# Patient Record
Sex: Female | Born: 1945
Health system: Southern US, Community
[De-identification: ages and names within clinical notes are randomized; demographics above are authoritative.]

## PROBLEM LIST (undated history)

## (undated) DIAGNOSIS — Z860100 Personal history of colon polyps, unspecified: Secondary | ICD-10-CM

## (undated) DIAGNOSIS — K219 Gastro-esophageal reflux disease without esophagitis: Secondary | ICD-10-CM

## (undated) DIAGNOSIS — I607 Nontraumatic subarachnoid hemorrhage from unspecified intracranial artery: Secondary | ICD-10-CM

## (undated) DIAGNOSIS — K2270 Barrett's esophagus without dysplasia: Secondary | ICD-10-CM

## (undated) HISTORY — DX: Gastro-esophageal reflux disease without esophagitis: K21.9

## (undated) HISTORY — PX: CEREBRAL ANEURYSM REPAIR: SHX164

## (undated) HISTORY — PX: FEMORAL ARTERY REPAIR: SHX1582

## (undated) HISTORY — DX: Personal history of colonic polyps: Z86.010

## (undated) HISTORY — DX: Nontraumatic subarachnoid hemorrhage from unspecified intracranial artery: I60.7

## (undated) HISTORY — DX: Personal history of colon polyps, unspecified: Z86.0100

## (undated) HISTORY — DX: Barrett's esophagus without dysplasia: K22.70

## (undated) HISTORY — PX: TONSILLECTOMY: SUR1361

---

## 1988-04-12 DIAGNOSIS — I607 Nontraumatic subarachnoid hemorrhage from unspecified intracranial artery: Secondary | ICD-10-CM

## 1988-04-12 HISTORY — DX: Nontraumatic subarachnoid hemorrhage from unspecified intracranial artery: I60.7

## 2004-10-19 ENCOUNTER — Ambulatory Visit (HOSPITAL_COMMUNITY): Admission: RE | Admit: 2004-10-19 | Discharge: 2004-10-19 | Payer: Self-pay | Admitting: Ophthalmology

## 2005-03-10 ENCOUNTER — Ambulatory Visit (HOSPITAL_COMMUNITY): Admission: RE | Admit: 2005-03-10 | Discharge: 2005-03-10 | Payer: Self-pay | Admitting: Obstetrics and Gynecology

## 2005-08-03 ENCOUNTER — Emergency Department (HOSPITAL_COMMUNITY): Admission: EM | Admit: 2005-08-03 | Discharge: 2005-08-03 | Payer: Self-pay | Admitting: Emergency Medicine

## 2006-03-15 ENCOUNTER — Ambulatory Visit (HOSPITAL_COMMUNITY): Admission: RE | Admit: 2006-03-15 | Discharge: 2006-03-15 | Payer: Self-pay | Admitting: Internal Medicine

## 2007-01-23 ENCOUNTER — Ambulatory Visit: Payer: Self-pay | Admitting: Gastroenterology

## 2007-02-09 ENCOUNTER — Ambulatory Visit: Payer: Self-pay | Admitting: Gastroenterology

## 2007-02-09 ENCOUNTER — Ambulatory Visit (HOSPITAL_COMMUNITY): Admission: RE | Admit: 2007-02-09 | Discharge: 2007-02-09 | Payer: Self-pay | Admitting: Gastroenterology

## 2007-02-09 HISTORY — PX: ESOPHAGOGASTRODUODENOSCOPY: SHX1529

## 2007-02-09 HISTORY — PX: COLONOSCOPY: SHX174

## 2007-02-28 ENCOUNTER — Ambulatory Visit: Payer: Self-pay | Admitting: Vascular Surgery

## 2007-03-07 ENCOUNTER — Ambulatory Visit: Payer: Self-pay | Admitting: Vascular Surgery

## 2007-03-20 ENCOUNTER — Ambulatory Visit (HOSPITAL_COMMUNITY): Admission: RE | Admit: 2007-03-20 | Discharge: 2007-03-20 | Payer: Self-pay | Admitting: Obstetrics and Gynecology

## 2007-11-15 ENCOUNTER — Ambulatory Visit (HOSPITAL_COMMUNITY): Admission: RE | Admit: 2007-11-15 | Discharge: 2007-11-15 | Payer: Self-pay | Admitting: Internal Medicine

## 2007-12-18 ENCOUNTER — Other Ambulatory Visit: Admission: RE | Admit: 2007-12-18 | Discharge: 2007-12-18 | Payer: Self-pay | Admitting: Obstetrics and Gynecology

## 2008-03-21 ENCOUNTER — Ambulatory Visit (HOSPITAL_COMMUNITY): Admission: RE | Admit: 2008-03-21 | Discharge: 2008-03-21 | Payer: Self-pay | Admitting: Obstetrics and Gynecology

## 2008-07-29 ENCOUNTER — Ambulatory Visit (HOSPITAL_COMMUNITY): Admission: RE | Admit: 2008-07-29 | Discharge: 2008-07-29 | Payer: Self-pay | Admitting: Obstetrics and Gynecology

## 2008-12-18 ENCOUNTER — Other Ambulatory Visit: Admission: RE | Admit: 2008-12-18 | Discharge: 2008-12-18 | Payer: Self-pay | Admitting: Obstetrics and Gynecology

## 2009-03-24 ENCOUNTER — Ambulatory Visit (HOSPITAL_COMMUNITY): Admission: RE | Admit: 2009-03-24 | Discharge: 2009-03-24 | Payer: Self-pay | Admitting: Internal Medicine

## 2010-01-06 ENCOUNTER — Ambulatory Visit (HOSPITAL_COMMUNITY): Admission: RE | Admit: 2010-01-06 | Discharge: 2010-01-06 | Payer: Self-pay | Admitting: Internal Medicine

## 2010-01-26 ENCOUNTER — Other Ambulatory Visit: Admission: RE | Admit: 2010-01-26 | Discharge: 2010-01-26 | Payer: Self-pay | Admitting: Obstetrics & Gynecology

## 2010-03-26 ENCOUNTER — Ambulatory Visit (HOSPITAL_COMMUNITY): Admission: RE | Admit: 2010-03-26 | Discharge: 2010-03-26 | Payer: Self-pay | Admitting: Obstetrics and Gynecology

## 2011-01-20 ENCOUNTER — Other Ambulatory Visit: Payer: Self-pay | Admitting: Obstetrics and Gynecology

## 2011-01-20 ENCOUNTER — Other Ambulatory Visit (HOSPITAL_COMMUNITY)
Admission: RE | Admit: 2011-01-20 | Discharge: 2011-01-20 | Disposition: A | Discharge status: Home / Self Care | Payer: BC Managed Care – PPO | Source: Ambulatory Visit | Attending: Obstetrics and Gynecology | Admitting: Obstetrics and Gynecology

## 2011-01-20 DIAGNOSIS — Z01419 Encounter for gynecological examination (general) (routine) without abnormal findings: Secondary | ICD-10-CM | POA: Insufficient documentation

## 2011-04-12 ENCOUNTER — Other Ambulatory Visit: Payer: Self-pay | Admitting: Obstetrics and Gynecology

## 2011-04-12 DIAGNOSIS — Z139 Encounter for screening, unspecified: Secondary | ICD-10-CM

## 2011-04-19 ENCOUNTER — Ambulatory Visit (HOSPITAL_COMMUNITY)
Admission: RE | Admit: 2011-04-19 | Discharge: 2011-04-19 | Disposition: A | Discharge status: Home / Self Care | Payer: BC Managed Care – PPO | Source: Ambulatory Visit | Attending: Obstetrics and Gynecology | Admitting: Obstetrics and Gynecology

## 2011-04-19 DIAGNOSIS — Z1231 Encounter for screening mammogram for malignant neoplasm of breast: Secondary | ICD-10-CM | POA: Insufficient documentation

## 2011-04-19 DIAGNOSIS — Z139 Encounter for screening, unspecified: Secondary | ICD-10-CM

## 2011-04-30 NOTE — Op Note (Signed)
NAMEDESTENY, FREEMAN                ACCOUNT NO.:  [id number]   MEDICAL RECORD NO.:  [id number]          PATIENT TYPE:  AMB   LOCATION:  DAY                           FACILITY:  [facility]   PHYSICIAN:  Kassie Mends, M.D.      DATE OF BIRTH:  13-Jun-1946   DATE OF PROCEDURE:  02/09/2007  DATE OF DISCHARGE:                               OPERATIVE REPORT   REFERRING PHYSICIAN:  Tilda Burrow, M.D.   PROCEDURE:  1. Colonoscopy.  2. Esophagogastroduodenoscopy.   INDICATIONS FOR EXAM:  [name] is a 65 year old female with a  personal history of polyps.  She has been seeing rectal bleeding off and  on for the last year.  She has a problem with constipation and  hemorrhoids.   FINDINGS:  1. Tortuous sigmoid colon and external hemorrhoids.  Otherwise normal      colon without evidence of polyps, masses, diverticula, inflammatory      changes or AVM's.  2. Normal esophagus without evidence of Barrett's.  Normal stomach and      duodenum.   RECOMMENDATIONS:  1. High fiber diet.  The patient was given information on high fiber      diet, polyps and hemorrhoids.  Her likely source of heme positive      stools and rectal bleeding is her external hemorrhoids.  2. Screening colonoscopy in five years.  I will check a CBC today and      if she is anemic then a capsule endoscopy will be performed to      complete an evaluation of her intestines.  3. She should have a repeat upper endoscopy in three years for      Barrett's surveillance.  She is given information on reflux      disease.  She should continue her Nexium 40 mg daily.  4. She may follow-up with me as needed.   MEDICATIONS:  1. Colonoscopy - Demerol 100 mg IV, Versed 8 mg IV.  2. Esophagogastroduodenoscopy - Versed 1 mg IV.   PROCEDURE AND TECHNIQUE:  Physical exam was performed and informed  consent was obtained from the patient after explaining the benefits,  risks and alternatives to the procedure.  The patient was connected  to  the monitor and placed in the left lateral position.  Continuous oxygen  was provided by nasal cannula and IV medicine administered through an  indwelling cannula.  After administration of sedation rectal exam, the  patient's rectum was intubated and the scope was advanced under direct  visualization to the cecum.  The scope was subsequently removed followed  by carefully examining the color, texture, anatomy and integrity of the  mucosa on the way out.  After the colonoscopy, the patient's esophagus was intubated and the  scope was advanced under direct visualization to the second portion of  the duodenum.  The patient was recovered in the endoscopy suite and  discharged home in satisfactory condition.      Kassie Mends, M.D.  Electronically Signed     SM/MEDQ  D:  02/09/2007  T:  02/09/2007  Job:  [id number]  cc:   Tilda Burrow, M.D.  Fax: [phone]

## 2011-04-30 NOTE — Consult Note (Signed)
Courtney Hayes, Courtney Hayes                ACCOUNT NO.:  [id number]   MEDICAL RECORD NO.:  [id number]          PATIENT TYPE:  AMB   LOCATION:                                FACILITY:  [facility]   PHYSICIAN:  Kassie Mends, M.D.      DATE OF BIRTH:  1946/04/29   DATE OF CONSULTATION:  01/23/2007  DATE OF DISCHARGE:                                 CONSULTATION   REFERRING PHYSICIAN:  Tilda Burrow, M.D.   REASON FOR CONSULTATION:  Personal history of polyps, rectal bleeding.   HISTORY OF PRESENT ILLNESS:  Courtney Hayes is a 65 year old female who has  been seeing rectal bleeding off and on for over a year.  She has having  problem with constipation.  She has been attributing her rectal bleeding  to hemorrhoids.  She had colonoscopy approximately 6 years ago without  any problems.  Her colonoscopy was performed in [place].  She has also  had a flexible sigmoidoscopy in the past which showed no evidence of  polyps.  She had Hemoccult performed in her OB/GYN's office and it was  positive.  She does use aspirin 81 mg twice daily due to a remote  history of an infection in her femoral artery.  Her vascular surgeon  recommended that she continue to take daily aspirin.  She denies any use  of ibuprofen, Motrin or Aleve.  She occasionally uses Tylenol.  She  denies any difficulty swallowing.  She does not have any heartburn or  indigestion as long as she takes Nexium.  She has carried the diagnosis  of gastroesophageal reflux disease since 2002.  She denies any abdominal  pain, nausea or vomiting.   PAST MEDICAL HISTORY:  1. Ruptured cerebral aneurysm in 1989 or 1990.  2. Gastroesophageal reflux disease since 2002.   PAST SURGICAL HISTORY:  1. Cerebral aneurysm repair.  2. Femoral artery replacement/transplant in [place].   ALLERGIES:  VALIUM (hives),  possible IV DYE allergy, LATEX ALLERGY   MEDICATIONS:  1. Nexium 40 mg a day.  2. Aspirin 81 mg two p.o. daily.   FAMILY HISTORY:  She has no  family history of colon cancer or colon  polyps, ovarian, breast or uterine cancer.   SOCIAL HISTORY:  She is married and has three children ages 43, 35, and  28.  She is not employed and does not use tobacco or alcohol products.   REVIEW OF SYSTEMS:  Per HPI,otherwise all systems negative.   PHYSICAL EXAMINATION:  GENERAL APPEARANCE:  She is in no apparent  distress, alert and oriented x4.  VITAL SIGNS:  Weight 122 pounds, height 5 feet 3 inches, BMI 21.8  (healthy), temperature 98.1, blood pressure 110/70, pulse 68. HEENT:  Atraumatic and normocephalic.  Pupils are equal, round and reactive to  light.  Mouth:  No oral lesions.  Posterior pharynx without erythema or  exudate. NECK:  Full range of motion, no lymphadenopathy.  LUNGS:  Clear  to auscultation bilaterally. CARDIOVASCULAR:  Regular rhythm, no murmur,  normal S1 and S2.  ABDOMEN:  Bowel sounds are present, soft, nontender,  nondistended,  no rebound or guarding, no hepatosplenomegaly.  No  abdominal bruits, no pulsatile masses. EXTREMITIES:  Without clubbing,  cyanosis, or edema.  NEUROLOGIC:  She has no focal neurologic deficits.   ASSESSMENT:  Courtney Hayes is a 65 year old female with rectal bleeding  which is likely secondary to her hemorrhoids.  She may also have an  upper GI source for Hemoccult positive stool.  The differential  diagnosis includes gastritis, esophagitis and a low likelihood of  gastric malignancy.   Thank you for allowing me to see Courtney Hayes in consultation.  My  recommendations follow.   RECOMMENDATIONS:  1. Courtney Hayes will be scheduled for a colonoscopy to evaluate her      rectal bleeding and heme-positive stool.  She will also be      scheduled for an upper endoscopy.  Her upper endoscopy will be      scheduled for two reasons.  1) To complete the evaluate for heme-      positive stool if no source for her heme-positive stool can be      found in her lower GI tract and 2) To screen for  Barrett's      esophagus.  I did discuss with Courtney Hayes Barrett's surveillance      and she is interested in initiating a screening program.  2. Will schedule follow-up appointment for Courtney Hayes after her      endoscopy is complete.  3. Will also initiate a management program for her hemorrhoids and      after her endoscopy is complete.      Kassie Mends, M.D.  Electronically Signed     SM/MEDQ  D:  01/24/2007  T:  01/24/2007  Job:  [id number]   cc:   Kingsley Callander. Ouida Sills, MD  Fax: [phone]

## 2011-12-24 ENCOUNTER — Encounter: Payer: Self-pay | Admitting: Gastroenterology

## 2012-01-26 ENCOUNTER — Other Ambulatory Visit: Payer: Self-pay | Admitting: Obstetrics and Gynecology

## 2012-01-26 ENCOUNTER — Other Ambulatory Visit (HOSPITAL_COMMUNITY)
Admission: RE | Admit: 2012-01-26 | Discharge: 2012-01-26 | Disposition: A | Discharge status: Home / Self Care | Payer: BC Managed Care – PPO | Source: Ambulatory Visit | Attending: Obstetrics and Gynecology | Admitting: Obstetrics and Gynecology

## 2012-01-26 DIAGNOSIS — Z01419 Encounter for gynecological examination (general) (routine) without abnormal findings: Secondary | ICD-10-CM | POA: Insufficient documentation

## 2012-03-17 ENCOUNTER — Other Ambulatory Visit: Payer: Self-pay | Admitting: Obstetrics and Gynecology

## 2012-03-17 DIAGNOSIS — Z139 Encounter for screening, unspecified: Secondary | ICD-10-CM

## 2012-05-01 ENCOUNTER — Ambulatory Visit (HOSPITAL_COMMUNITY)
Admission: RE | Admit: 2012-05-01 | Discharge: 2012-05-01 | Disposition: A | Discharge status: Home / Self Care | Payer: Medicare Other | Source: Ambulatory Visit | Attending: Obstetrics and Gynecology | Admitting: Obstetrics and Gynecology

## 2012-05-01 DIAGNOSIS — Z1231 Encounter for screening mammogram for malignant neoplasm of breast: Secondary | ICD-10-CM | POA: Insufficient documentation

## 2012-05-01 DIAGNOSIS — Z139 Encounter for screening, unspecified: Secondary | ICD-10-CM

## 2012-10-10 DIAGNOSIS — N390 Urinary tract infection, site not specified: Secondary | ICD-10-CM | POA: Diagnosis not present

## 2012-11-21 DIAGNOSIS — M1990 Unspecified osteoarthritis, unspecified site: Secondary | ICD-10-CM | POA: Diagnosis not present

## 2012-11-21 DIAGNOSIS — Z79899 Other long term (current) drug therapy: Secondary | ICD-10-CM | POA: Diagnosis not present

## 2012-11-21 DIAGNOSIS — E785 Hyperlipidemia, unspecified: Secondary | ICD-10-CM | POA: Diagnosis not present

## 2012-11-27 ENCOUNTER — Other Ambulatory Visit (HOSPITAL_COMMUNITY): Payer: Self-pay | Admitting: Internal Medicine

## 2012-11-27 DIAGNOSIS — I498 Other specified cardiac arrhythmias: Secondary | ICD-10-CM | POA: Diagnosis not present

## 2012-11-27 DIAGNOSIS — M949 Disorder of cartilage, unspecified: Secondary | ICD-10-CM | POA: Diagnosis not present

## 2012-11-27 DIAGNOSIS — K219 Gastro-esophageal reflux disease without esophagitis: Secondary | ICD-10-CM | POA: Diagnosis not present

## 2012-11-27 DIAGNOSIS — K469 Unspecified abdominal hernia without obstruction or gangrene: Secondary | ICD-10-CM | POA: Diagnosis not present

## 2012-11-27 DIAGNOSIS — M899 Disorder of bone, unspecified: Secondary | ICD-10-CM | POA: Diagnosis not present

## 2012-11-27 DIAGNOSIS — Z23 Encounter for immunization: Secondary | ICD-10-CM | POA: Diagnosis not present

## 2012-11-27 DIAGNOSIS — M8580 Other specified disorders of bone density and structure, unspecified site: Secondary | ICD-10-CM

## 2013-01-02 ENCOUNTER — Ambulatory Visit (HOSPITAL_COMMUNITY)
Admission: RE | Admit: 2013-01-02 | Discharge: 2013-01-02 | Disposition: A | Discharge status: Home / Self Care | Payer: Medicare Other | Source: Ambulatory Visit | Attending: Internal Medicine | Admitting: Internal Medicine

## 2013-01-02 DIAGNOSIS — M899 Disorder of bone, unspecified: Secondary | ICD-10-CM | POA: Insufficient documentation

## 2013-01-02 DIAGNOSIS — M949 Disorder of cartilage, unspecified: Secondary | ICD-10-CM | POA: Diagnosis not present

## 2013-01-02 DIAGNOSIS — M8580 Other specified disorders of bone density and structure, unspecified site: Secondary | ICD-10-CM

## 2013-01-16 ENCOUNTER — Telehealth: Payer: Self-pay | Admitting: Gastroenterology

## 2013-01-16 NOTE — Telephone Encounter (Signed)
Pt called to set up tcs per her PCP. I didn't see her on the recall list and she said that she isn't having any problems or have a hx of polyps. I told her that the triage nurse would be calling her back at [phone]. Patient also voiced that last time she had her tcs done that she was over sedated with anesthesia and didn't want to be heavily sedated like that for her procedure. I told her to mention those concerns with the nurse when she calls.

## 2013-01-17 ENCOUNTER — Telehealth: Payer: Self-pay

## 2013-01-17 NOTE — Telephone Encounter (Signed)
See separate triage note.  

## 2013-01-17 NOTE — Telephone Encounter (Signed)
LMOM to call.

## 2013-01-17 NOTE — Telephone Encounter (Signed)
Pt is overdue for repeat EGD for Barrett's surveillance. OV on 02/06/2013 at 8:30 AM with Lorenza Burton, NP to get procedures scheduled.

## 2013-01-17 NOTE — Telephone Encounter (Signed)
Pt called back but Tyler Aas was at lunch. I told she would call her back.

## 2013-02-05 ENCOUNTER — Encounter: Payer: Self-pay | Admitting: Gastroenterology

## 2013-02-06 ENCOUNTER — Ambulatory Visit (INDEPENDENT_AMBULATORY_CARE_PROVIDER_SITE_OTHER): Payer: BC Managed Care – PPO | Admitting: Urgent Care

## 2013-02-06 ENCOUNTER — Encounter (HOSPITAL_COMMUNITY): Payer: Self-pay | Admitting: Pharmacy Technician

## 2013-02-06 ENCOUNTER — Encounter: Payer: Self-pay | Admitting: Urgent Care

## 2013-02-06 VITALS — BP 112/61 | HR 62 | Temp 97.6°F | Ht 62.0 in | Wt 114.0 lb

## 2013-02-06 DIAGNOSIS — Z860100 Personal history of colon polyps, unspecified: Secondary | ICD-10-CM | POA: Insufficient documentation

## 2013-02-06 MED ORDER — PEG 3350-KCL-NA BICARB-NACL 420 G PO SOLR: 4000.0000 mL | ORAL | Status: DC

## 2013-02-06 NOTE — Patient Instructions (Addendum)
Continue Nexium 40mg  daily EGD (upper endoscopy) & colonoscopy with Dr Darrick Penna Call with any questions or concerns. Have a great day!

## 2013-02-06 NOTE — Assessment & Plan Note (Signed)
Courtney Hayes is a pleasant 67 y.o. female with hx of colonic polyps.  She is due for surveillance colonoscopy with Dr Darrick Penna.   I have discussed risks & benefits which include, but are not limited to, bleeding, infection, perforation & drug reaction.  The patient agrees with this plan & written consent will be obtained.

## 2013-02-06 NOTE — Assessment & Plan Note (Signed)
Chronic GERD well controlled on nexium 40mg  daily.  Dr Darrick Penna had recommended EGD in 2012 to look for Barrett's, however pt admits she forgot to follow up.  EGD with Dr Darrick Penna at the same time as colonoscopy.  I have discussed risks & benefits which include, but are not limited to, bleeding, infection, perforation & drug reaction.  The patient agrees with this plan & written consent will be obtained.    Continue nexium 40mg  daily

## 2013-02-06 NOTE — Progress Notes (Signed)
Faxed to PCP

## 2013-02-06 NOTE — Progress Notes (Signed)
Primary Care Physician:  Carylon Perches, MD Primary Gastroenterologist:  Dr. Jonette Eva  Chief Complaint  Patient presents with  . Colonoscopy    HPI:  Courtney Hayes is a 67 y.o. female here to set up colonoscopy & EGD.  She has hx of colon polyps many years ago in [place].  Last colonoscopy by Dr Darrick Penna 5 yrs ago was normal (hemorrhoids).  She did feel "overly sedated" after last colonoscopy & thinks she may have had too much medicine.  Dr Darrick Penna recommended EGD to screen for Barrett's esophagus in 3 yrs, but she never followed up until now.  She takes Nexium 40mg  daily.   Denies heartburn, indigestion, nausea, vomiting, dysphagia, odynophagia or anorexia.   Denies constipation, diarrhea, rectal bleeding, melena or weight loss.  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   Past Medical History  Diagnosis Date  . Barrett's esophagus   . Hx of colonic polyps   . Cerebral aneurysm rupture 04/1988  . GERD (gastroesophageal reflux disease)     Past Surgical History  Procedure Laterality Date  . Colonoscopy    02/09/2007    [id number] sigmoid colon and external hemorrhoids  . Esophagogastroduodenoscopy    02/09/2007    SLF: Normal esophagus without evidence of Barrett's  . Cerebral aneurysm repair    . Femoral artery repair      Current Outpatient Prescriptions  Medication Sig Dispense Refill  . aspirin 81 MG tablet Take 81 mg by mouth daily.      Marland Kitchen esomeprazole (NEXIUM) 40 MG capsule Take 40 mg by mouth daily before breakfast.      . polyethylene glycol-electrolytes (TRILYTE) 420 G solution Take 4,000 mLs by mouth as directed.  4000 mL  0   No current facility-administered medications for this visit.    Allergies as of 02/06/2013 - Review Complete 02/06/2013  Allergen Reaction Noted  . Niacin and related Hives 02/06/2013  . Valium (diazepam) Hives 02/06/2013    Family History  Problem Relation Age of Onset  . Colon cancer Neg Hx      History   Social History  . Marital Status: Married    Spouse Name: N/A    Number of Children: 3  . Years of Education: N/A   Occupational History  . unemployed, Masters in [employer]    Social History Main Topics  . Smoking status: Never Smoker   . Smokeless tobacco: Not on file  . Alcohol Use: No  . Drug Use: No  . Sexually Active: Not on file   Other Topics Concern  . 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 Resp: Denies dyspnea at rest, dyspnea with exercise, cough, sputum, wheezing, coughing up blood, and pleurisy. GI: Denies vomiting blood, jaundice, and fecal incontinence.   Denies dysphagia or odynophagia. GU : Denies urinary burning, blood in urine, urinary frequency, urinary hesitancy, nocturnal urination, and urinary incontinence. MS: Denies joint pain, limitation of movement, and swelling, stiffness, low back pain, extremity pain. Denies muscle weakness, cramps, atrophy.  Derm: Denies rash, itching, dry skin, hives, moles, warts, or unhealing ulcers.  Psych: Denies depression, anxiety, memory loss, suicidal ideation, hallucinations, paranoia, and confusion. Heme: Denies bruising, bleeding, and enlarged lymph nodes. Neuro:  Denies any headaches, dizziness, paresthesias. Endo:  Denies any problems with DM, thyroid, adrenal function.  Physical Exam: BP 112/61  Pulse 62  Temp(Src) 97.6 F (36.4 C) (Oral)  Ht 5\' 2"  (1.575 m)  Wt 114 lb (51.71 kg)  BMI 20.85 kg/m2 No LMP recorded. Patient is postmenopausal. General:   Alert,  Well-developed, well-nourished, pleasant and cooperative in NAD Head:  Normocephalic and atraumatic. Eyes:  Sclera clear, no icterus.   Conjunctiva  pink. Ears:  Normal auditory acuity. Nose:  No deformity, discharge, or lesions. Mouth:  No deformity or lesions,oropharynx pink & moist. Neck:  Supple; no masses or thyromegaly. Lungs:  Clear throughout to auscultation.   No wheezes, crackles, or rhonchi. No acute distress. Heart:  Regular rate and rhythm; no murmurs, clicks, rubs,  or gallops. Abdomen:  Normal bowel sounds.  No bruits.  Soft, non-tender and non-distended without masses, hepatosplenomegaly or hernias noted.  No guarding or rebound tenderness.   Rectal:  Deferred. Msk:  Symmetrical without gross deformities. Normal posture. Pulses:  Normal pulses noted. Extremities:  No clubbing or edema. Neurologic:  Alert and  oriented x4;  grossly normal neurologically. Skin:  Intact without significant lesions or rashes. Lymph Nodes:  No significant cervical adenopathy. Psych:  Alert and cooperative. Normal mood and affect.

## 2013-02-13 ENCOUNTER — Encounter (HOSPITAL_COMMUNITY)
Admission: RE | Disposition: A | Discharge status: Home / Self Care | Payer: Self-pay | Source: Ambulatory Visit | Attending: Gastroenterology

## 2013-02-13 ENCOUNTER — Encounter (HOSPITAL_COMMUNITY): Payer: Self-pay | Admitting: *Deleted

## 2013-02-13 ENCOUNTER — Ambulatory Visit (HOSPITAL_COMMUNITY)
Admission: RE | Admit: 2013-02-13 | Discharge: 2013-02-13 | Disposition: A | Discharge status: Home / Self Care | Payer: Medicare Other | Source: Ambulatory Visit | Attending: Gastroenterology | Admitting: Gastroenterology

## 2013-02-13 DIAGNOSIS — K2990 Gastroduodenitis, unspecified, without bleeding: Secondary | ICD-10-CM | POA: Diagnosis not present

## 2013-02-13 DIAGNOSIS — K2270 Barrett's esophagus without dysplasia: Secondary | ICD-10-CM | POA: Insufficient documentation

## 2013-02-13 DIAGNOSIS — K2970 Gastritis, unspecified, without bleeding: Secondary | ICD-10-CM | POA: Diagnosis not present

## 2013-02-13 DIAGNOSIS — K648 Other hemorrhoids: Secondary | ICD-10-CM | POA: Insufficient documentation

## 2013-02-13 DIAGNOSIS — Z860100 Personal history of colon polyps, unspecified: Secondary | ICD-10-CM | POA: Insufficient documentation

## 2013-02-13 HISTORY — PX: COLONOSCOPY WITH ESOPHAGOGASTRODUODENOSCOPY (EGD): SHX5779

## 2013-02-13 SURGERY — COLONOSCOPY WITH ESOPHAGOGASTRODUODENOSCOPY (EGD)
Anesthesia: Moderate Sedation

## 2013-02-13 MED ORDER — MEPERIDINE HCL 100 MG/ML IJ SOLN
INTRAMUSCULAR | Status: AC
  Filled 2013-02-13: qty 2

## 2013-02-13 MED ORDER — MEPERIDINE HCL 100 MG/ML IJ SOLN
INTRAMUSCULAR | Status: DC | PRN
  Administered 2013-02-13 (×2): 25 mg via INTRAVENOUS

## 2013-02-13 MED ORDER — MIDAZOLAM HCL 5 MG/5ML IJ SOLN
INTRAMUSCULAR | Status: DC | PRN
  Administered 2013-02-13 (×2): 1 mg via INTRAVENOUS
  Administered 2013-02-13: 2 mg via INTRAVENOUS
  Administered 2013-02-13: 1 mg via INTRAVENOUS

## 2013-02-13 MED ORDER — MIDAZOLAM HCL 5 MG/5ML IJ SOLN
INTRAMUSCULAR | Status: AC
  Filled 2013-02-13: qty 10

## 2013-02-13 MED ORDER — SODIUM CHLORIDE 0.45 % IV SOLN
INTRAVENOUS | Status: DC
  Administered 2013-02-13: 1000 mL via INTRAVENOUS

## 2013-02-13 MED ORDER — BUTAMBEN-TETRACAINE-BENZOCAINE 2-2-14 % EX AERO
INHALATION_SPRAY | CUTANEOUS | Status: DC | PRN
  Administered 2013-02-13: 1 via TOPICAL

## 2013-02-13 MED ORDER — STERILE WATER FOR IRRIGATION IR SOLN
Status: DC | PRN
  Administered 2013-02-13: 11:00:00

## 2013-02-13 NOTE — Op Note (Signed)
[facility] [address] [place], [address]   COLONOSCOPY PROCEDURE REPORT  PATIENT: Courtney, Hayes  MR#: [id number] BIRTHDATE: May 29, 1946 , 66  yrs. old GENDER: Female ENDOSCOPIST: Jonette Eva, MD REFERRED Raphael Gibney, M.D.  Christin Bach, M.D. PROCEDURE DATE:  02/13/2013 PROCEDURE:   Colonoscopy, screening  with Entrada overtube INDICATIONS:High risk patient with personal history of colonic polyps. FELT LIKE SHE HAD TOO MUCH SEDATION IN [id number]-[id number] MEDICATIONS: Demerol 50 mg IV and Versed 4 mg IV  DESCRIPTION OF PROCEDURE:    Physical exam was performed.  Informed consent was obtained from the patient after explaining the benefits, risks, and alternatives to procedure.  The patient was connected to monitor and placed in left lateral position. Continuous oxygen was provided by nasal cannula and IV medicine administered through an indwelling cannula.  After administration of sedation and rectal exam, the patients rectum was intubated and the EC-3490Li ([id number]) and EG-2990i ([id number])  colonoscope was advanced under direct visualization to the cecum.  The scope was removed slowly by carefully examining the color, texture, anatomy, and integrity mucosa on the way out.  The patient was recovered in endoscopy and discharged home in satisfactory condition.       COLON FINDINGS: The TC/[place] colon ARE redundant.  OVERTUBE/Manual abdominal counter-pressure was used to reach the cecum.  The patient was moved on to their back to reach the cecum, The colon was otherwise normal.  There was no diverticulosis, inflammation, polyps or cancers unless previously stated.  , and Small internal hemorrhoids were found.  PREP QUALITY: excellent. CECAL W/D TIME: 10 minutes  COMPLICATIONS: None  ENDOSCOPIC IMPRESSION: 1.   Small internal hemorrhoids  RECOMMENDATIONS: HIGH FIBER DIET TCS IN 5 YEARS WITH OVERTUBE/PEDS  SCOPE       _______________________________ Rosalie DoctorJonette Eva, MD 02/13/2013 11:22 AM

## 2013-02-13 NOTE — H&P (Signed)
  Primary Care Physician:  Carylon Perches, MD Primary Gastroenterologist:  Dr. Darrick Penna  Pre-Procedure History & Physical: HPI:  Courtney Hayes is a 67 y.o. female here for SCREENING FOR COLON CA AND Barrett's.  Past Medical History  Diagnosis Date  . Barrett's esophagus   . Hx of colonic polyps   . Cerebral aneurysm rupture 04/1988  . GERD (gastroesophageal reflux disease)    Past Surgical History  Procedure Laterality Date  . Colonoscopy    02/09/2007    PPI:[id number] sigmoid colon and external hemorrhoids  . Esophagogastroduodenoscopy    02/09/2007    SLF: Normal esophagus without evidence of Barrett's  . Cerebral aneurysm repair    . Femoral artery repair      Prior to Admission medications   Medication Sig Start Date End Date Taking? Authorizing Provider  ALPHA LIPOIC ACID PO Take 1 tablet by mouth daily.   Yes Historical Provider, MD  Ascorbic Acid (VITAMIN C) 1000 MG tablet Take 1,000 mg by mouth daily.   Yes Historical Provider, MD  aspirin 81 MG tablet Take 81 mg by mouth daily.   Yes Historical Provider, MD  esomeprazole (NEXIUM) 40 MG capsule Take 40 mg by mouth daily before breakfast.   Yes Historical Provider, MD  fexofenadine (ALLEGRA) 180 MG tablet Take 180 mg by mouth daily.   Yes Historical Provider, MD  fish oil-omega-3 fatty acids 1000 MG capsule Take 1 g by mouth daily.   Yes Historical Provider, MD  L-LYSINE PO Take 1 tablet by mouth daily.   Yes Historical Provider, MD  lactobacillus acidophilus (BACID) TABS Take 1 tablet by mouth daily.   Yes Historical Provider, MD  Multiple Vitamins-Minerals (MULTIVITAMINS THER. W/MINERALS) TABS Take 1 tablet by mouth daily.   Yes Historical Provider, MD    Allergies as of 02/06/2013 - Review Complete 02/06/2013  Allergen Reaction Noted  . Niacin and related Hives 02/06/2013  . Valium (diazepam) Hives 02/06/2013    Family History  Problem Relation Age of Onset  . Colon cancer Neg Hx     History   Social History  .  Marital Status: Married    Spouse Name: N/A    Number of Children: 3  . Years of Education: N/A   Occupational History  . unemployed, Masters in [employer]    Social History Main Topics  . Smoking status: Never Smoker   . Smokeless tobacco: Not on file  . Alcohol Use: No  . Drug Use: No  . Sexually Active: Not on file   Other Topics Concern  . 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 Neck:  Supple; Lungs:  Clear throughout to auscultation.    Heart:  Regular rate and rhythm. Abdomen:  Soft, nontender and nondistended. Normal bowel sounds, without guarding, and without rebound.   Neurologic:  Alert and  oriented x4;  grossly normal neurologically.  Impression/Plan:     SCREENING FOR COLON CA AND Barrett's  PLAN:  1.EGD/TCS TODAY

## 2013-02-13 NOTE — Op Note (Signed)
[facility] [address] [place], [address]   ENDOSCOPY PROCEDURE REPORT  PATIENT: Courtney Hayes, Courtney Hayes  MR#: [id number] BIRTHDATE: 1946-04-28 , 66  yrs. old GENDER: Female  ENDOSCOPIST: Jonette Eva, MD REFERRED Raphael Gibney, M.D.  Christin Bach, M.D.  PROCEDURE DATE: 02/13/2013 PROCEDURE:   EGD w/ biopsy  INDICATIONS:Barrett's screening. REFLUX IS WELL CONTROLLED ON NEXIUM. MEDICATIONS: TCVS+ Versed 1mg  IV TOPICAL ANESTHETIC:   Cetacaine Spray  DESCRIPTION OF PROCEDURE:     Physical exam was performed.  Informed consent was obtained from the patient after explaining the benefits, risks, and alternatives to the procedure.  The patient was connected to the monitor and placed in the left lateral position.  Continuous oxygen was provided by nasal cannula and IV medicine administered through an indwelling cannula.  After administration of sedation, the patients esophagus was intubated and the EG-2990i ([id number])  endoscope was advanced under direct visualization to the second portion of the duodenum.  The scope was removed slowly by carefully examining the color, texture, anatomy, and integrity of the mucosa on the way out.  The patient was recovered in endoscopy and discharged home in satisfactory condition.   ESOPHAGUS: The mucosa of the esophagus appeared normal.  STOMACH: Mild non-erosive gastritis (inflammation) was found in the gastric antrum.  Multiple biopsies were performed.  DUODENUM: The duodenal mucosa showed no abnormalities in the bulb and second portion of the duodenum.  COMPLICATIONS:   None  ENDOSCOPIC IMPRESSION: 1.   MILD Non-erosive gastritis  RECOMMENDATIONS: AWAIT BIOPSY CONTINUE NEXIUM LOW FAT/HIGH FIBER DIET OPV PRN   REPEAT EXAM:   _______________________________ Rosalie DoctorJonette Eva, MD 02/13/2013 11:40 AM

## 2013-02-14 ENCOUNTER — Telehealth: Payer: Self-pay | Admitting: Gastroenterology

## 2013-02-14 DIAGNOSIS — Z86010 Personal history of colonic polyps: Secondary | ICD-10-CM

## 2013-02-14 DIAGNOSIS — K2990 Gastroduodenitis, unspecified, without bleeding: Secondary | ICD-10-CM

## 2013-02-14 DIAGNOSIS — K2970 Gastritis, unspecified, without bleeding: Secondary | ICD-10-CM

## 2013-02-14 DIAGNOSIS — Z1211 Encounter for screening for malignant neoplasm of colon: Secondary | ICD-10-CM

## 2013-02-14 DIAGNOSIS — K219 Gastro-esophageal reflux disease without esophagitis: Secondary | ICD-10-CM

## 2013-02-14 DIAGNOSIS — K648 Other hemorrhoids: Secondary | ICD-10-CM

## 2013-02-14 NOTE — Telephone Encounter (Signed)
Please call pt. HER stomach Bx shows mild gastritis FROM ASA USE.  CONTINUE NEXIUM EVERY MORNING.  AVOID ITEMS THAT TRIGGER GASTRITIS.   FOLLOW A LOW FAT/HIGH FIBER DIET. AVOID ITEMS THAT CAUSE BLOATING.   REPEAT COLONOSCOPY IN 5 YEARS DUE TO A PERSONAL HISTORY OF POLYPS.

## 2013-02-15 NOTE — Telephone Encounter (Signed)
Path faxed to PCP, recall made 

## 2013-02-15 NOTE — Telephone Encounter (Signed)
Called and informed pt.  

## 2013-02-19 ENCOUNTER — Encounter (HOSPITAL_COMMUNITY): Payer: Self-pay | Admitting: Gastroenterology

## 2013-04-05 ENCOUNTER — Other Ambulatory Visit (HOSPITAL_COMMUNITY): Payer: Self-pay | Admitting: Internal Medicine

## 2013-04-05 DIAGNOSIS — Z139 Encounter for screening, unspecified: Secondary | ICD-10-CM

## 2013-05-08 ENCOUNTER — Ambulatory Visit (HOSPITAL_COMMUNITY)
Admission: RE | Admit: 2013-05-08 | Discharge: 2013-05-08 | Disposition: A | Discharge status: Home / Self Care | Payer: Medicare Other | Source: Ambulatory Visit | Attending: Internal Medicine | Admitting: Internal Medicine

## 2013-05-08 DIAGNOSIS — Z1231 Encounter for screening mammogram for malignant neoplasm of breast: Secondary | ICD-10-CM | POA: Insufficient documentation

## 2013-05-08 DIAGNOSIS — Z139 Encounter for screening, unspecified: Secondary | ICD-10-CM

## 2013-06-02 NOTE — Progress Notes (Signed)
REVIEWED.  TCS MAR 2014 [facility] EGD MAR 2014 MILD GASTRITIS. NEXT TCS 5 YEARS

## 2013-11-26 DIAGNOSIS — Z961 Presence of intraocular lens: Secondary | ICD-10-CM | POA: Diagnosis not present

## 2013-11-26 DIAGNOSIS — H40019 Open angle with borderline findings, low risk, unspecified eye: Secondary | ICD-10-CM | POA: Diagnosis not present

## 2013-11-26 DIAGNOSIS — H2510 Age-related nuclear cataract, unspecified eye: Secondary | ICD-10-CM | POA: Diagnosis not present

## 2013-11-27 DIAGNOSIS — E785 Hyperlipidemia, unspecified: Secondary | ICD-10-CM | POA: Diagnosis not present

## 2013-11-27 DIAGNOSIS — M1990 Unspecified osteoarthritis, unspecified site: Secondary | ICD-10-CM | POA: Diagnosis not present

## 2013-11-27 DIAGNOSIS — Z79899 Other long term (current) drug therapy: Secondary | ICD-10-CM | POA: Diagnosis not present

## 2013-11-27 DIAGNOSIS — T7840XA Allergy, unspecified, initial encounter: Secondary | ICD-10-CM | POA: Diagnosis not present

## 2013-11-27 DIAGNOSIS — K219 Gastro-esophageal reflux disease without esophagitis: Secondary | ICD-10-CM | POA: Diagnosis not present

## 2013-12-03 DIAGNOSIS — Z0000 Encounter for general adult medical examination without abnormal findings: Secondary | ICD-10-CM | POA: Diagnosis not present

## 2013-12-03 DIAGNOSIS — I498 Other specified cardiac arrhythmias: Secondary | ICD-10-CM | POA: Diagnosis not present

## 2014-04-02 ENCOUNTER — Other Ambulatory Visit (HOSPITAL_COMMUNITY): Payer: Self-pay | Admitting: Internal Medicine

## 2014-04-02 DIAGNOSIS — Z1231 Encounter for screening mammogram for malignant neoplasm of breast: Secondary | ICD-10-CM

## 2014-06-03 ENCOUNTER — Ambulatory Visit (HOSPITAL_COMMUNITY)
Admission: RE | Admit: 2014-06-03 | Discharge: 2014-06-03 | Disposition: A | Discharge status: Home / Self Care | Payer: Medicare Other | Source: Ambulatory Visit | Attending: Internal Medicine | Admitting: Internal Medicine

## 2014-06-03 DIAGNOSIS — Z1231 Encounter for screening mammogram for malignant neoplasm of breast: Secondary | ICD-10-CM | POA: Insufficient documentation

## 2014-12-16 DIAGNOSIS — H2511 Age-related nuclear cataract, right eye: Secondary | ICD-10-CM | POA: Diagnosis not present

## 2014-12-16 DIAGNOSIS — Z961 Presence of intraocular lens: Secondary | ICD-10-CM | POA: Diagnosis not present

## 2015-02-03 ENCOUNTER — Other Ambulatory Visit (HOSPITAL_COMMUNITY)
Admission: RE | Admit: 2015-02-03 | Discharge: 2015-02-03 | Disposition: A | Discharge status: Home / Self Care | Payer: Medicare Other | Source: Ambulatory Visit | Attending: Obstetrics and Gynecology | Admitting: Obstetrics and Gynecology

## 2015-02-03 ENCOUNTER — Telehealth: Payer: Self-pay | Admitting: *Deleted

## 2015-02-03 ENCOUNTER — Encounter: Payer: Self-pay | Admitting: Obstetrics and Gynecology

## 2015-02-03 ENCOUNTER — Ambulatory Visit (INDEPENDENT_AMBULATORY_CARE_PROVIDER_SITE_OTHER): Payer: Medicare Other | Admitting: Obstetrics and Gynecology

## 2015-02-03 VITALS — BP 118/60 | Ht 62.5 in | Wt 108.8 lb

## 2015-02-03 DIAGNOSIS — Z124 Encounter for screening for malignant neoplasm of cervix: Secondary | ICD-10-CM

## 2015-02-03 DIAGNOSIS — Z1151 Encounter for screening for human papillomavirus (HPV): Secondary | ICD-10-CM | POA: Insufficient documentation

## 2015-02-03 DIAGNOSIS — Z01419 Encounter for gynecological examination (general) (routine) without abnormal findings: Secondary | ICD-10-CM

## 2015-02-03 NOTE — Telephone Encounter (Signed)
Left message x 1. JSY 

## 2015-02-03 NOTE — Progress Notes (Signed)
Patient ID: Courtney SchanzDana B Neumeister, female   DOB: 1946-09-22, 69 y.o.   MRN: [id number]  Assessment:  Annual Gyn Exam   Plan:  1. pap smear done, next pap due 3 years 2. return annually or prn 3    Annual mammogram advised, done with her PCP Subjective:  Courtney SchanzDana B Hayes is a 69 y.o. female No obstetric history on file. who presents for annual exam. No LMP recorded. Patient is postmenopausal. she is on No HT The patient has no complaints today. Pt denies bladder issues, vaginal issues and problems with BMs. She is sexually active, but denies pain or problems.  The following portions of the patient's history were reviewed and updated as appropriate: allergies, current medications, past family history, past medical history, past social history, past surgical history and problem list. Past Medical History  Diagnosis Date  . Barrett's esophagus   . Hx of colonic polyps   . Cerebral aneurysm rupture 04/1988  . GERD (gastroesophageal reflux disease)     Past Surgical History  Procedure Laterality Date  . Colonoscopy    02/09/2007    WUJ:[id number]:Tortuous sigmoid colon and external hemorrhoids  . Esophagogastroduodenoscopy    02/09/2007    SLF: Normal esophagus without evidence of Barrett's  . Cerebral aneurysm repair    . Femoral artery repair    . Colonoscopy with esophagogastroduodenoscopy (egd) N/A 02/13/2013    Procedure: COLONOSCOPY WITH ESOPHAGOGASTRODUODENOSCOPY (EGD);  Surgeon: West BaliSandi L Fields, MD;  Location: [facility];  Service: Endoscopy;  Laterality: N/A;  11:00     Current outpatient prescriptions:  .  ALPHA LIPOIC ACID PO, Take 1 tablet by mouth daily., Disp: , Rfl:  .  Ascorbic Acid (VITAMIN C) 1000 MG tablet, Take 1,000 mg by mouth daily., Disp: , Rfl:  .  esomeprazole (NEXIUM) 40 MG capsule, Take 40 mg by mouth daily before breakfast., Disp: , Rfl:  .  fish oil-omega-3 fatty acids 1000 MG capsule, Take 1 g by mouth daily., Disp: , Rfl:  .  L-LYSINE PO, Take 1 tablet by mouth daily.,  Disp: , Rfl:  .  Multiple Vitamins-Minerals (MULTIVITAMINS THER. W/MINERALS) TABS, Take 1 tablet by mouth daily., Disp: , Rfl:  .  aspirin 81 MG tablet, Take 81 mg by mouth daily., Disp: , Rfl:  .  fexofenadine (ALLEGRA) 180 MG tablet, Take 180 mg by mouth daily., Disp: , Rfl:  .  lactobacillus acidophilus (BACID) TABS, Take 1 tablet by mouth daily., Disp: , Rfl:   Review of Systems Constitutional: negative Gastrointestinal: negative Genitourinary: negative  Objective:  BP 118/60 mmHg  Ht 5\' 2"  (1.575 m)  Wt 188 lb (85.276 kg)  BMI 34.38 kg/m2   BMI: Body mass index is 34.38 kg/(m^2).  General Appearance: Alert, appropriate appearance for age. No acute distress HEENT: Grossly normal Neck / Thyroid:  Cardiovascular: RRR; normal S1, S2, no murmur Lungs: CTA bilaterally Back: No CVAT Breast Exam: No masses or nodes. No dimpling, nipple retraction or discharge. Gastrointestinal: Soft, non-tender, no masses or organomegaly Pelvic Exam: Vulva and vagina appear normal. Bimanual exam reveals normal uterus and adnexa. Vaginal: normal without tenderness, induration or masses Cervix: normal appearance Adnexa: normal bimanual exam Uterus: normal single, nontender Rectovaginal: No masses or lesions; good levator plate muscles  Lymphatic Exam: Non-palpable nodes in neck, clavicular, axillary, or inguinal regions  Skin: no rash or abnormalities Neurologic: Normal gait and speech, no tremor  Psychiatric: Alert and oriented, appropriate affect.  Urinalysis:Not done  Hemoccult: Negative  Christin BachJohn Paizleigh Wilds. MD Pgr  [phone] 9:43 AM   This chart was scribed for Tilda Burrow, MD by Gwenyth Ober, ED Scribe. This patient was seen in room 1 and the patient's care was started at 9:43 AM.   I personally performed the services described in this documentation, which was scribed in my presence. The recorded information has been reviewed and considered accurate. It has been edited as necessary  during review. Tilda Burrow, MD

## 2015-02-03 NOTE — Telephone Encounter (Signed)
Spoke with pt. Pt was seen today and weighs 108.8lb. 188lb was put in chart. I corrected chart. Encounter closed. JSY

## 2015-02-04 LAB — CYTOLOGY - PAP

## 2015-02-10 ENCOUNTER — Telehealth: Payer: Self-pay | Admitting: *Deleted

## 2015-02-10 NOTE — Telephone Encounter (Signed)
-----   Message from Tilda BurrowJohn Ferguson V, MD sent at 02/05/2015  1:12 PM EST ----- Negative pap.

## 2015-02-10 NOTE — Telephone Encounter (Signed)
Pt aware of results 

## 2015-04-14 DIAGNOSIS — E785 Hyperlipidemia, unspecified: Secondary | ICD-10-CM | POA: Diagnosis not present

## 2015-04-14 DIAGNOSIS — Z79899 Other long term (current) drug therapy: Secondary | ICD-10-CM | POA: Diagnosis not present

## 2015-04-14 DIAGNOSIS — K219 Gastro-esophageal reflux disease without esophagitis: Secondary | ICD-10-CM | POA: Diagnosis not present

## 2015-05-08 ENCOUNTER — Other Ambulatory Visit (HOSPITAL_COMMUNITY): Payer: Self-pay | Admitting: Internal Medicine

## 2015-05-08 DIAGNOSIS — M8580 Other specified disorders of bone density and structure, unspecified site: Secondary | ICD-10-CM

## 2015-05-08 DIAGNOSIS — K219 Gastro-esophageal reflux disease without esophagitis: Secondary | ICD-10-CM | POA: Diagnosis not present

## 2015-05-08 DIAGNOSIS — Z8679 Personal history of other diseases of the circulatory system: Secondary | ICD-10-CM | POA: Diagnosis not present

## 2015-05-08 DIAGNOSIS — Z780 Asymptomatic menopausal state: Secondary | ICD-10-CM

## 2015-05-08 DIAGNOSIS — Z23 Encounter for immunization: Secondary | ICD-10-CM | POA: Diagnosis not present

## 2015-05-09 ENCOUNTER — Other Ambulatory Visit: Payer: Self-pay | Admitting: Obstetrics and Gynecology

## 2015-05-09 DIAGNOSIS — Z1231 Encounter for screening mammogram for malignant neoplasm of breast: Secondary | ICD-10-CM

## 2015-05-27 ENCOUNTER — Ambulatory Visit (HOSPITAL_COMMUNITY)
Admission: RE | Admit: 2015-05-27 | Discharge: 2015-05-27 | Disposition: A | Discharge status: Home / Self Care | Payer: Medicare Other | Source: Ambulatory Visit | Attending: Internal Medicine | Admitting: Internal Medicine

## 2015-05-27 DIAGNOSIS — Z780 Asymptomatic menopausal state: Secondary | ICD-10-CM | POA: Insufficient documentation

## 2015-05-27 DIAGNOSIS — M8580 Other specified disorders of bone density and structure, unspecified site: Secondary | ICD-10-CM | POA: Diagnosis not present

## 2015-05-27 DIAGNOSIS — M898X8 Other specified disorders of bone, other site: Secondary | ICD-10-CM | POA: Diagnosis not present

## 2015-06-05 ENCOUNTER — Ambulatory Visit (HOSPITAL_COMMUNITY)
Admission: RE | Admit: 2015-06-05 | Discharge: 2015-06-05 | Disposition: A | Discharge status: Home / Self Care | Payer: Medicare Other | Source: Ambulatory Visit | Attending: Obstetrics and Gynecology | Admitting: Obstetrics and Gynecology

## 2015-06-05 DIAGNOSIS — Z1231 Encounter for screening mammogram for malignant neoplasm of breast: Secondary | ICD-10-CM | POA: Insufficient documentation

## 2015-09-09 DIAGNOSIS — Z681 Body mass index (BMI) 19 or less, adult: Secondary | ICD-10-CM | POA: Diagnosis not present

## 2015-09-09 DIAGNOSIS — L258 Unspecified contact dermatitis due to other agents: Secondary | ICD-10-CM | POA: Diagnosis not present

## 2016-01-06 DIAGNOSIS — H2511 Age-related nuclear cataract, right eye: Secondary | ICD-10-CM | POA: Diagnosis not present

## 2016-01-06 DIAGNOSIS — Z961 Presence of intraocular lens: Secondary | ICD-10-CM | POA: Diagnosis not present

## 2016-05-03 ENCOUNTER — Other Ambulatory Visit (HOSPITAL_COMMUNITY): Payer: Self-pay | Admitting: Internal Medicine

## 2016-05-03 DIAGNOSIS — Z1231 Encounter for screening mammogram for malignant neoplasm of breast: Secondary | ICD-10-CM

## 2016-06-07 ENCOUNTER — Ambulatory Visit (HOSPITAL_COMMUNITY)
Admission: RE | Admit: 2016-06-07 | Discharge: 2016-06-07 | Disposition: A | Discharge status: Home / Self Care | Payer: Medicare Other | Source: Ambulatory Visit | Attending: Internal Medicine | Admitting: Internal Medicine

## 2016-06-07 DIAGNOSIS — Z1231 Encounter for screening mammogram for malignant neoplasm of breast: Secondary | ICD-10-CM | POA: Diagnosis not present

## 2016-06-13 IMAGING — MG MM DIGITAL SCREENING
4 series · 4 of 4 positions shown · non-contrast
Comparison: Previous exam(s).

CLINICAL DATA: Screening.

EXAM:
DIGITAL SCREENING BILATERAL MAMMOGRAM WITH CAD

[L CC]
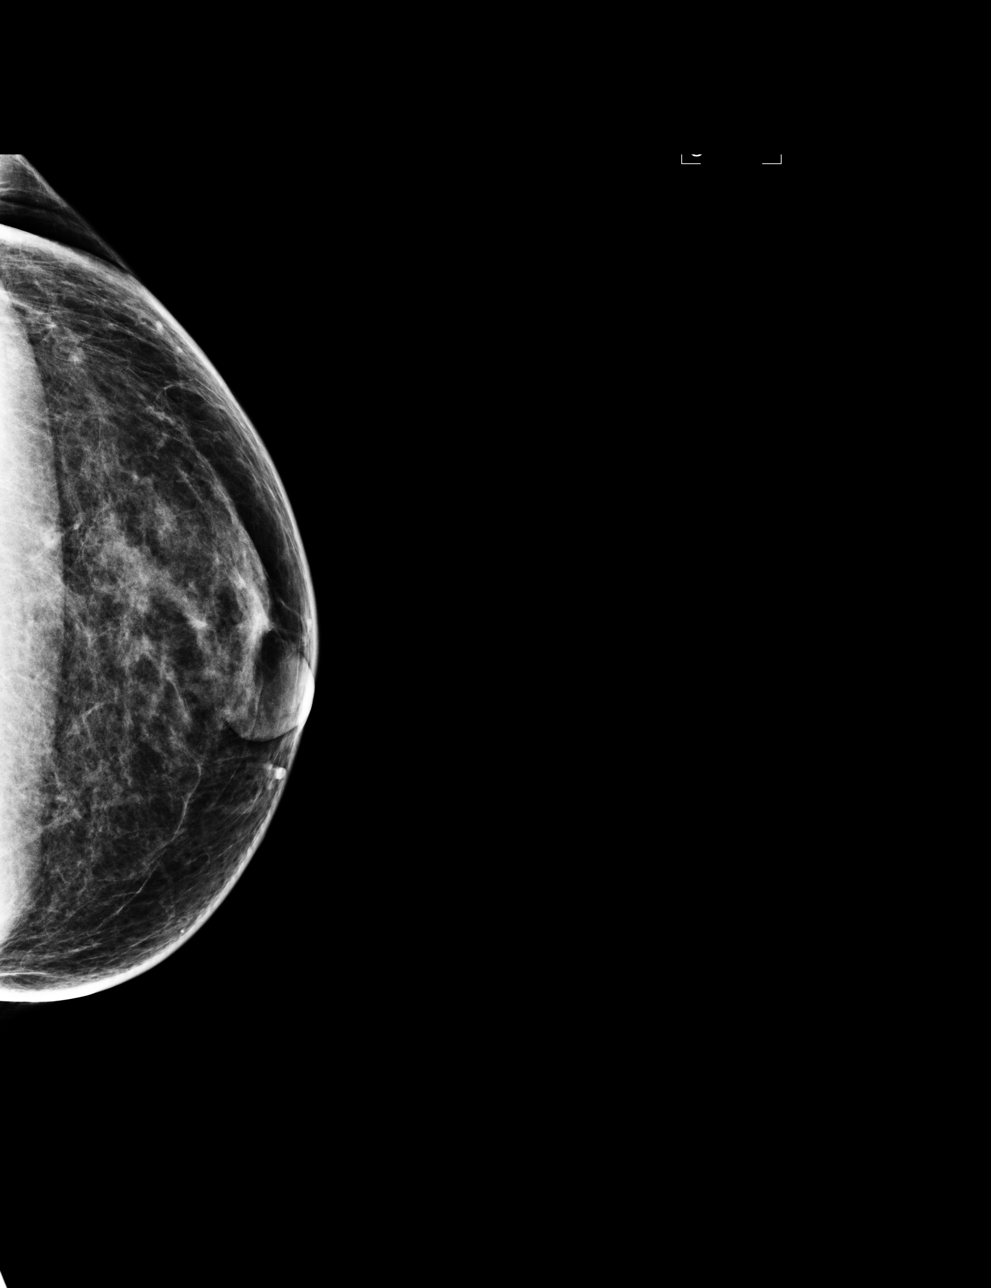

[L MLO]
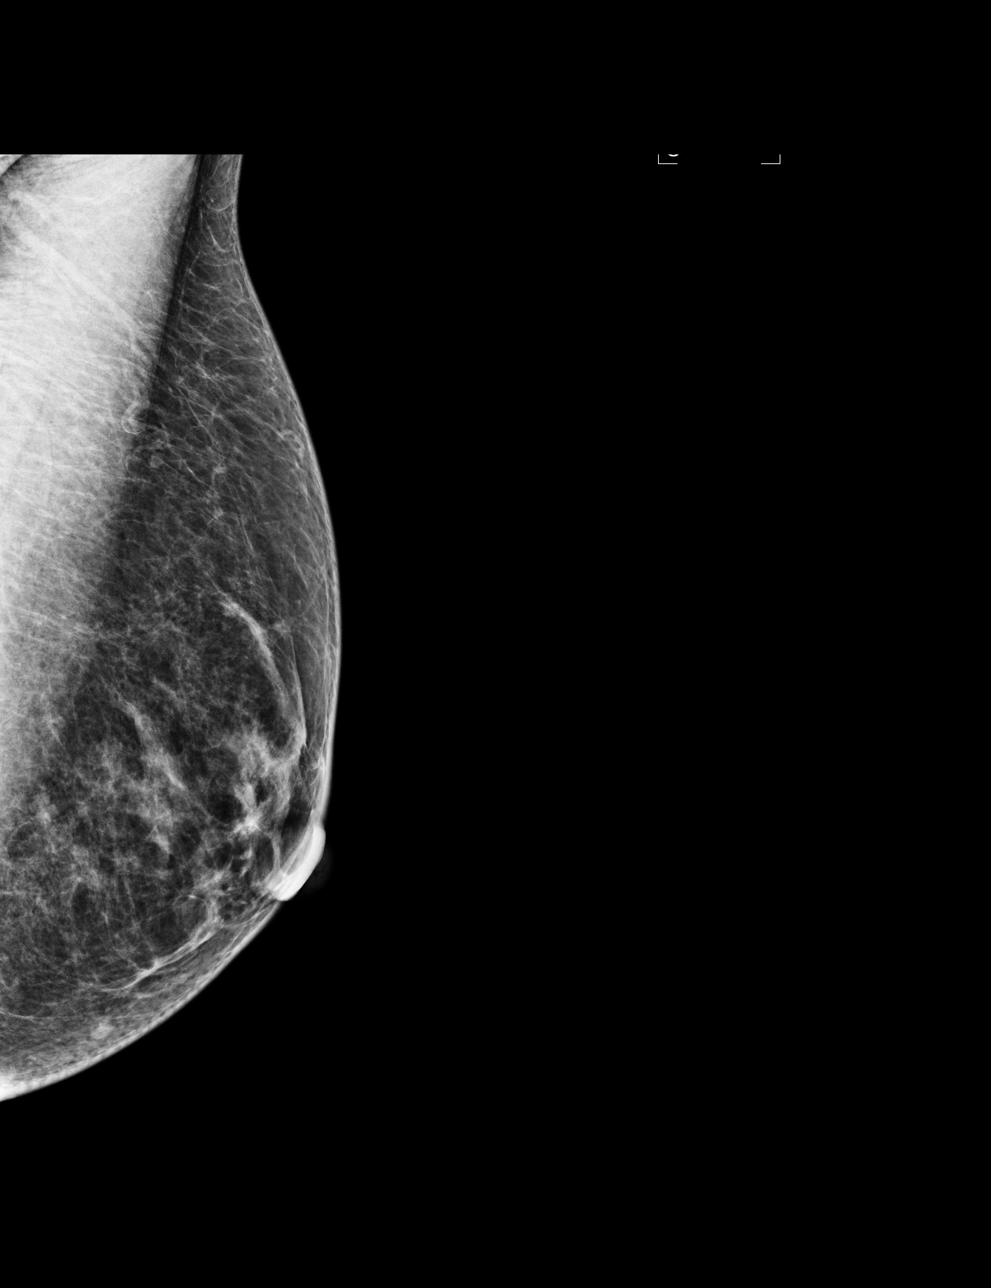

[R CC]
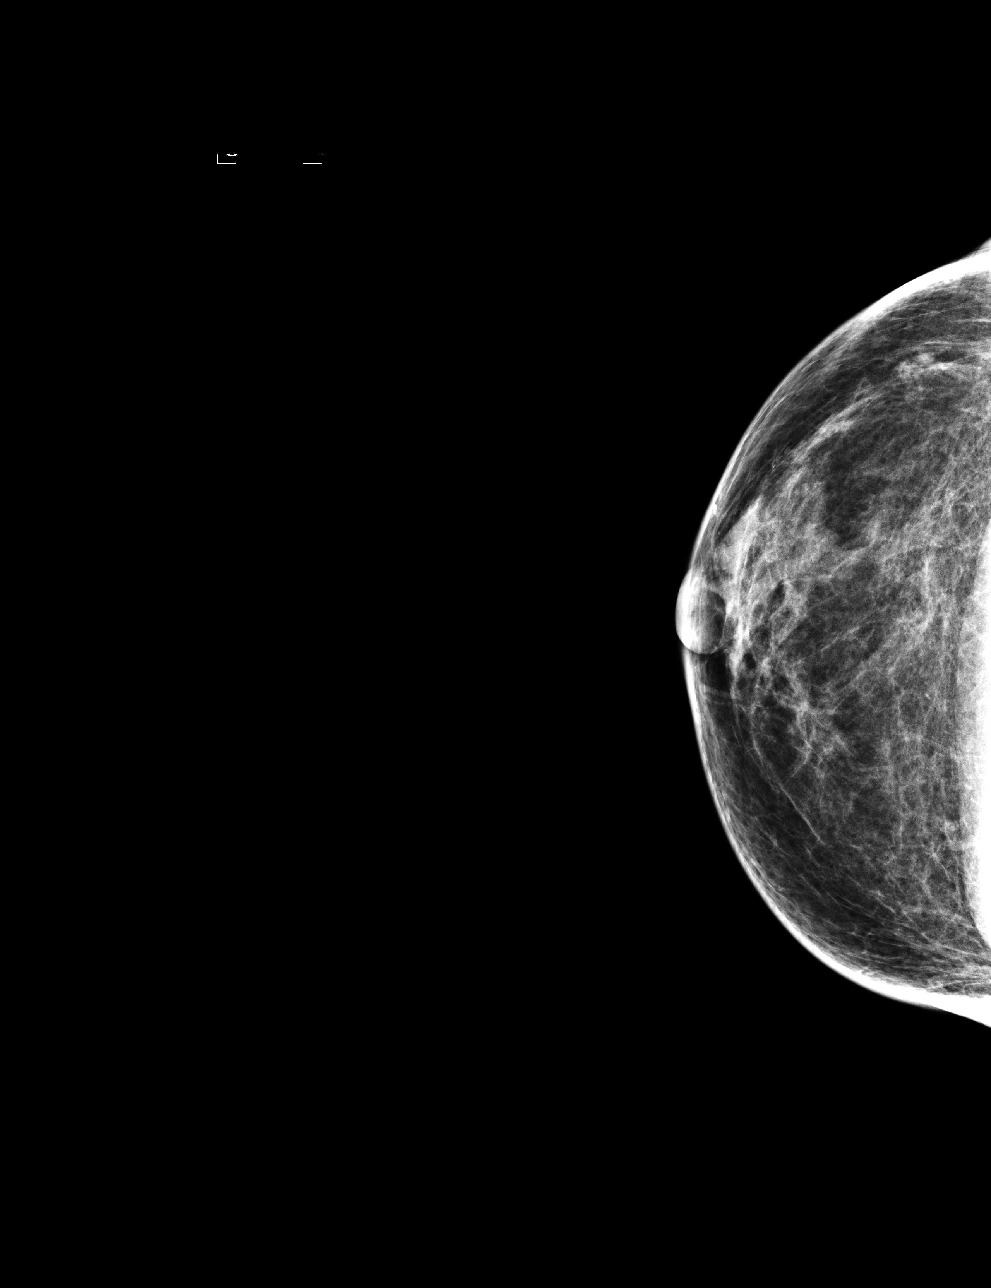

[R MLO]
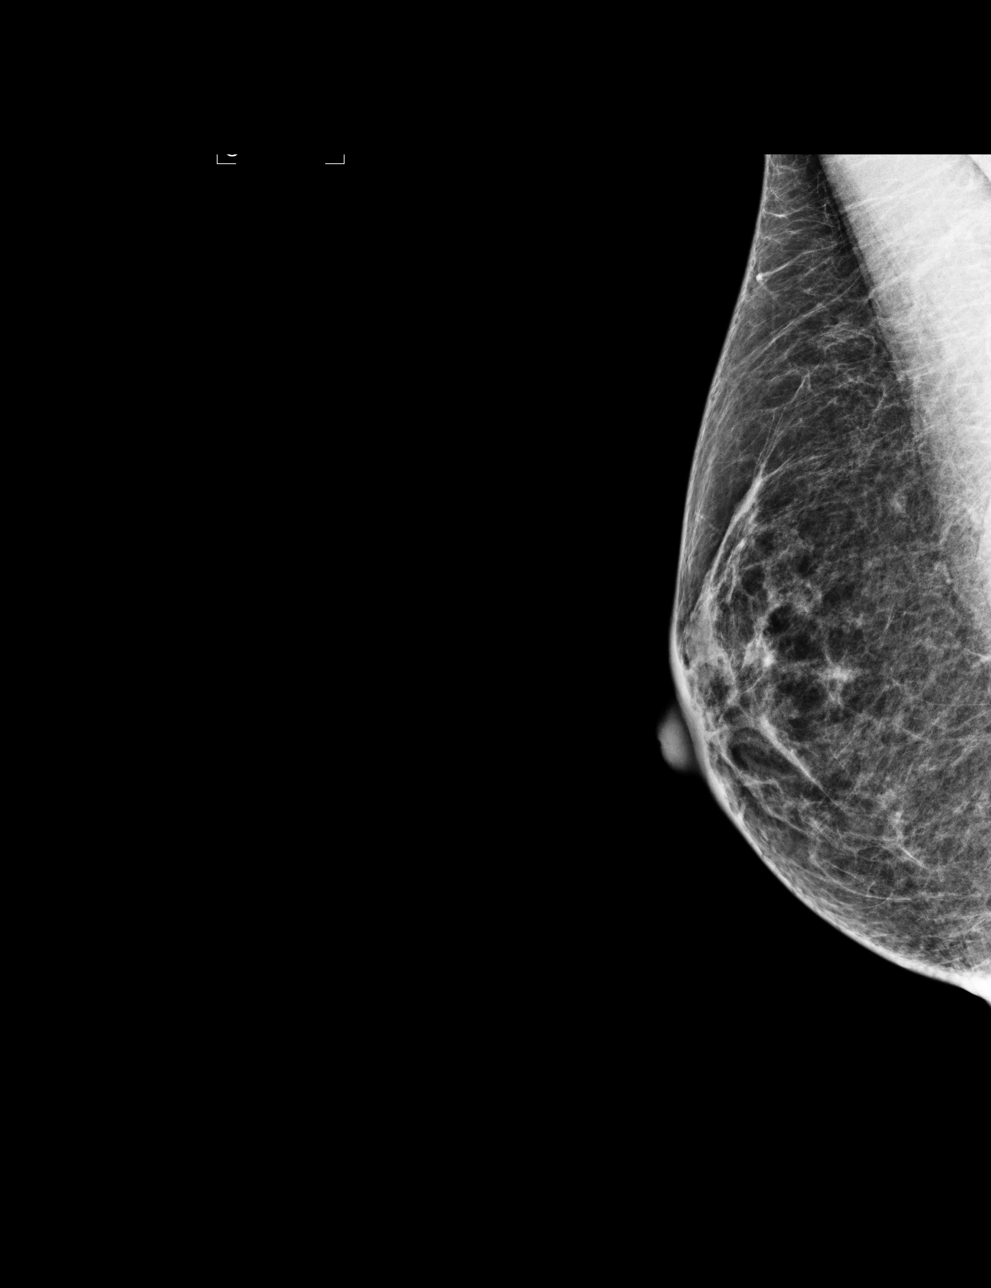

[4 of 4 positions shown; findings below may reference images not displayed]

ACR Breast Density Category b: There are scattered areas of
fibroglandular density.
FINDINGS: There are no findings suspicious for malignancy. Images were
processed with CAD.
IMPRESSION: No mammographic evidence of malignancy. A result letter of this
screening mammogram will be mailed directly to the patient.

RECOMMENDATION:
Screening mammogram in one year. (Code:AS-G-LCT)

BI-RADS CATEGORY  1: Negative.

## 2016-06-18 DIAGNOSIS — K219 Gastro-esophageal reflux disease without esophagitis: Secondary | ICD-10-CM | POA: Diagnosis not present

## 2016-06-18 DIAGNOSIS — Z79899 Other long term (current) drug therapy: Secondary | ICD-10-CM | POA: Diagnosis not present

## 2016-06-18 DIAGNOSIS — E785 Hyperlipidemia, unspecified: Secondary | ICD-10-CM | POA: Diagnosis not present

## 2016-06-18 DIAGNOSIS — I609 Nontraumatic subarachnoid hemorrhage, unspecified: Secondary | ICD-10-CM | POA: Diagnosis not present

## 2016-06-25 DIAGNOSIS — Z681 Body mass index (BMI) 19 or less, adult: Secondary | ICD-10-CM | POA: Diagnosis not present

## 2016-06-25 DIAGNOSIS — I609 Nontraumatic subarachnoid hemorrhage, unspecified: Secondary | ICD-10-CM | POA: Diagnosis not present

## 2016-06-25 DIAGNOSIS — K219 Gastro-esophageal reflux disease without esophagitis: Secondary | ICD-10-CM | POA: Diagnosis not present

## 2016-06-25 DIAGNOSIS — R001 Bradycardia, unspecified: Secondary | ICD-10-CM | POA: Diagnosis not present

## 2016-07-06 ENCOUNTER — Ambulatory Visit (INDEPENDENT_AMBULATORY_CARE_PROVIDER_SITE_OTHER): Payer: TRICARE For Life (TFL) | Admitting: Obstetrics and Gynecology

## 2016-07-06 DIAGNOSIS — Z01419 Encounter for gynecological examination (general) (routine) without abnormal findings: Secondary | ICD-10-CM

## 2016-08-04 DIAGNOSIS — Z01419 Encounter for gynecological examination (general) (routine) without abnormal findings: Secondary | ICD-10-CM | POA: Insufficient documentation

## 2016-08-04 NOTE — Progress Notes (Signed)
  Assessment:  Annual Gyn Exam   Plan:  1. pap smear done, next pap due 3 y 2. return annually or prn 3    Annual mammogram advised Subjective:  Courtney Hayes is a 70 y.o. female No obstetric history on file. who presents for annual exam. No LMP recorded. Patient is postmenopausal. The patient has complaints today of none  The following portions of the patient's history were reviewed and updated as appropriate: allergies, current medications, past family history, past medical history, past social history, past surgical history and problem list. Past Medical History:  Diagnosis Date  . Barrett's esophagus   . Cerebral aneurysm rupture 04/1988  . GERD (gastroesophageal reflux disease)   . Hx of colonic polyps     Past Surgical History:  Procedure Laterality Date  . CEREBRAL ANEURYSM REPAIR    . COLONOSCOPY    02/09/2007   [id number]:Tortuous sigmoid colon and external hemorrhoids  . COLONOSCOPY WITH ESOPHAGOGASTRODUODENOSCOPY (EGD) N/A 02/13/2013   Procedure: COLONOSCOPY WITH ESOPHAGOGASTRODUODENOSCOPY (EGD);  Surgeon: West BaliSandi L Fields, MD;  Location: [facility];  Service: Endoscopy;  Laterality: N/A;  11:00  . ESOPHAGOGASTRODUODENOSCOPY    02/09/2007   SLF: Normal esophagus without evidence of Barrett's  . FEMORAL ARTERY REPAIR       Current Outpatient Prescriptions:  .  ALPHA LIPOIC ACID PO, Take 1 tablet by mouth daily., Disp: , Rfl:  .  Ascorbic Acid (VITAMIN C) 1000 MG tablet, Take 1,000 mg by mouth daily., Disp: , Rfl:  .  aspirin 81 MG tablet, Take 81 mg by mouth daily., Disp: , Rfl:  .  esomeprazole (NEXIUM) 40 MG capsule, Take 40 mg by mouth daily before breakfast., Disp: , Rfl:  .  fexofenadine (ALLEGRA) 180 MG tablet, Take 180 mg by mouth daily., Disp: , Rfl:  .  fish oil-omega-3 fatty acids 1000 MG capsule, Take 1 g by mouth daily., Disp: , Rfl:  .  L-LYSINE PO, Take 1 tablet by mouth daily., Disp: , Rfl:  .  lactobacillus acidophilus (BACID) TABS, Take 1 tablet by mouth  daily., Disp: , Rfl:  .  Multiple Vitamins-Minerals (MULTIVITAMINS THER. W/MINERALS) TABS, Take 1 tablet by mouth daily., Disp: , Rfl:   Review of Systems Constitutional: negative Gastrointestinal: negative Genitourinary: wnl   Objective:  There were no vitals taken for this visit.   BMI: There is no height or weight on file to calculate BMI.  General Appearance: Alert, appropriate appearance for age. No acute distress HEENT: Grossly normal Neck / Thyroid:  Cardiovascular: RRR; normal S1, S2, no murmur Lungs: CTA bilaterally Back: No CVAT Breast Exam: No dimpling, nipple retraction or discharge. No masses or nodes. and No masses or nodes.No dimpling, nipple retraction or discharge. Gastrointestinal: Soft, non-tender, no masses or organomegaly Pelvic Exam: Vulva and vagina appear normal. Bimanual exam reveals normal uterus and adnexa. Rectovaginal: not indicated, normal rectal, no masses and guaiac negative stool obtained Lymphatic Exam: Non-palpable nodes in neck, clavicular, axillary, or inguinal regions Skin: no rash or abnormalities Neurologic: Normal gait and speech, no tremor  Psychiatric: Alert and oriented, appropriate affect.  Urinalysis:Not done  Christin BachJohn Adhrit Krenz. MD Pgr [phone] 10:44 PM

## 2016-11-12 ENCOUNTER — Ambulatory Visit (HOSPITAL_COMMUNITY)
Admission: RE | Admit: 2016-11-12 | Discharge: 2016-11-12 | Disposition: A | Discharge status: Home / Self Care | Payer: Medicare Other | Source: Ambulatory Visit | Attending: Internal Medicine | Admitting: Internal Medicine

## 2016-11-12 ENCOUNTER — Other Ambulatory Visit (HOSPITAL_COMMUNITY): Payer: Self-pay | Admitting: Internal Medicine

## 2016-11-12 DIAGNOSIS — M25512 Pain in left shoulder: Secondary | ICD-10-CM | POA: Insufficient documentation

## 2017-05-11 ENCOUNTER — Other Ambulatory Visit (HOSPITAL_COMMUNITY): Payer: Self-pay | Admitting: Internal Medicine

## 2017-05-11 DIAGNOSIS — Z1231 Encounter for screening mammogram for malignant neoplasm of breast: Secondary | ICD-10-CM

## 2017-06-23 ENCOUNTER — Ambulatory Visit (HOSPITAL_COMMUNITY)
Admission: RE | Admit: 2017-06-23 | Discharge: 2017-06-23 | Disposition: A | Discharge status: Home / Self Care | Payer: Medicare Other | Source: Ambulatory Visit | Attending: Internal Medicine | Admitting: Internal Medicine

## 2017-06-23 DIAGNOSIS — Z1231 Encounter for screening mammogram for malignant neoplasm of breast: Secondary | ICD-10-CM | POA: Diagnosis not present

## 2017-06-30 DIAGNOSIS — M899 Disorder of bone, unspecified: Secondary | ICD-10-CM | POA: Diagnosis not present

## 2017-06-30 DIAGNOSIS — Z79899 Other long term (current) drug therapy: Secondary | ICD-10-CM | POA: Diagnosis not present

## 2017-06-30 DIAGNOSIS — E785 Hyperlipidemia, unspecified: Secondary | ICD-10-CM | POA: Diagnosis not present

## 2017-06-30 DIAGNOSIS — K219 Gastro-esophageal reflux disease without esophagitis: Secondary | ICD-10-CM | POA: Diagnosis not present

## 2017-07-28 DIAGNOSIS — Z23 Encounter for immunization: Secondary | ICD-10-CM | POA: Diagnosis not present

## 2017-07-28 DIAGNOSIS — R001 Bradycardia, unspecified: Secondary | ICD-10-CM | POA: Diagnosis not present

## 2017-07-28 DIAGNOSIS — K219 Gastro-esophageal reflux disease without esophagitis: Secondary | ICD-10-CM | POA: Diagnosis not present

## 2017-07-28 DIAGNOSIS — M8580 Other specified disorders of bone density and structure, unspecified site: Secondary | ICD-10-CM | POA: Diagnosis not present

## 2017-07-28 DIAGNOSIS — Z6820 Body mass index (BMI) 20.0-20.9, adult: Secondary | ICD-10-CM | POA: Diagnosis not present

## 2017-08-01 ENCOUNTER — Other Ambulatory Visit (HOSPITAL_COMMUNITY): Payer: Self-pay | Admitting: Internal Medicine

## 2017-08-01 DIAGNOSIS — Z780 Asymptomatic menopausal state: Secondary | ICD-10-CM

## 2017-08-03 ENCOUNTER — Other Ambulatory Visit (HOSPITAL_COMMUNITY)
Admission: RE | Admit: 2017-08-03 | Discharge: 2017-08-03 | Disposition: A | Discharge status: Home / Self Care | Payer: Medicare Other | Source: Ambulatory Visit | Attending: Medical | Admitting: Medical

## 2017-08-03 ENCOUNTER — Ambulatory Visit (INDEPENDENT_AMBULATORY_CARE_PROVIDER_SITE_OTHER): Payer: Medicare Other | Admitting: Medical

## 2017-08-03 VITALS — BP 110/58 | HR 62 | Wt 108.5 lb

## 2017-08-03 DIAGNOSIS — Z124 Encounter for screening for malignant neoplasm of cervix: Secondary | ICD-10-CM

## 2017-08-03 DIAGNOSIS — Z01419 Encounter for gynecological examination (general) (routine) without abnormal findings: Secondary | ICD-10-CM | POA: Insufficient documentation

## 2017-08-03 NOTE — Progress Notes (Signed)
Subjective:    Courtney Hayes is a 71 y.o. female who presents for annual exam. The patient has no complaints today. The patient is sexually active. GYN screening history: last pap: approximate date 2016 and was normal. The patient is not currently taking hormone replacement therapy. Patient denies post-menopausal vaginal bleeding.. The patient wears seatbelts: yes. The patient participates in regular exercise: yes. Has the patient ever been transfused or tattooed?: blood transfusion in 1989. The patient reports that there is not domestic violence in her life.   Menstrual History: OB History    No data available      Menopause at 71 years old No LMP recorded. Patient is postmenopausal.    The following portions of the patient's history were reviewed and updated as appropriate: allergies, current medications, past family history, past medical history, past social history, past surgical history and problem list.  Review of Systems Pertinent items are noted in HPI.    Objective:     Physical Exam  Constitutional: She is oriented to person, place, and time. She appears well-developed and well-nourished. No distress.  HENT:  Head: Normocephalic.  Eyes: EOM are normal.  Neck: Normal range of motion. No thyromegaly present.  Cardiovascular: Normal rate, regular rhythm and normal heart sounds.  Exam reveals no friction rub.   No murmur heard. Pulmonary/Chest: Effort normal and breath sounds normal. No respiratory distress. She has no wheezes.  Abdominal: Soft. Bowel sounds are normal. She exhibits no distension and no mass. There is no tenderness. There is no rebound and no guarding.  Genitourinary: Vagina normal. There is no rash, tenderness or lesion on the right labia. There is no rash, tenderness or lesion on the left labia. Uterus is not enlarged and not tender. Cervix exhibits no motion tenderness, no discharge and no friability. Right adnexum displays no mass and no tenderness. Left  adnexum displays no mass and no tenderness. No erythema, tenderness or bleeding in the vagina. No vaginal discharge found.  Musculoskeletal: Normal range of motion. She exhibits no edema.  Neurological: She is alert and oriented to person, place, and time.  Skin: Skin is warm and dry. No erythema.  Psychiatric: She has a normal mood and affect. Her behavior is normal. Judgment and thought content normal.  Vitals reviewed.       Assessment:    Normal gyn exam Menopause  Normal Mammogram - July   Plan:    Await pap smear results. Patient requests pap smear today. Advised this will be the last pap smear needed.   Follow-up wit [facility] in 1-2 years for annual exam if desired, may also continue to have annual physical with PCP if desired  Kathlene Cote 08/03/2017 11:08 AM

## 2017-08-03 NOTE — Patient Instructions (Signed)

## 2017-08-04 ENCOUNTER — Ambulatory Visit (HOSPITAL_COMMUNITY)
Admission: RE | Admit: 2017-08-04 | Discharge: 2017-08-04 | Disposition: A | Discharge status: Home / Self Care | Payer: Medicare Other | Source: Ambulatory Visit | Attending: Internal Medicine | Admitting: Internal Medicine

## 2017-08-04 DIAGNOSIS — Z13820 Encounter for screening for osteoporosis: Secondary | ICD-10-CM | POA: Diagnosis not present

## 2017-08-04 DIAGNOSIS — Z780 Asymptomatic menopausal state: Secondary | ICD-10-CM | POA: Insufficient documentation

## 2017-08-04 DIAGNOSIS — M8580 Other specified disorders of bone density and structure, unspecified site: Secondary | ICD-10-CM | POA: Insufficient documentation

## 2017-08-04 DIAGNOSIS — M8589 Other specified disorders of bone density and structure, multiple sites: Secondary | ICD-10-CM | POA: Diagnosis not present

## 2017-08-05 LAB — CYTOLOGY - PAP
DIAGNOSIS: NEGATIVE
HPV (WINDOPATH): NOT DETECTED

## 2017-12-26 DIAGNOSIS — M545 Low back pain: Secondary | ICD-10-CM | POA: Diagnosis not present

## 2018-01-05 ENCOUNTER — Encounter: Payer: Self-pay | Admitting: Gastroenterology

## 2018-02-02 ENCOUNTER — Ambulatory Visit (INDEPENDENT_AMBULATORY_CARE_PROVIDER_SITE_OTHER): Payer: Medicare Other

## 2018-02-02 DIAGNOSIS — Z86010 Personal history of colonic polyps: Secondary | ICD-10-CM

## 2018-02-02 MED ORDER — PEG-KCL-NACL-NASULF-NA ASC-C 100 G PO SOLR: 1.0000 | ORAL | 0 refills | Status: DC

## 2018-02-02 NOTE — Progress Notes (Signed)
Gastroenterology Pre-Procedure Review  Request Date:02/02/18 Requesting Physician: 5 year recall Dr.Fields - hx of polyps  PATIENT REVIEW QUESTIONS: The patient responded to the following health history questions as indicated:    1. Diabetes Melitis: no 2. Joint replacements in the past 12 months: no 3. Major health problems in the past 3 months: no 4. Has an artificial valve or MVP: no 5. Has a defibrillator: no 6. Has been advised in past to take antibiotics in advance of a procedure like teeth cleaning: no 7. Family history of colon cancer: no  8. Alcohol Use: no 9. History of sleep apnea: no  10. History of coronary artery or other vascular stents placed within the last 12 months: no 11. History of any prior anesthesia complications: no    MEDICATIONS & ALLERGIES:    Patient reports the following regarding taking any blood thinners:   Plavix? no Aspirin? no Coumadin? no Brilinta? no Xarelto? no Eliquis? no Pradaxa? no Savaysa? no Effient? no  Patient confirms/reports the following medications:  Current Outpatient Medications  Medication Sig Dispense Refill  . ALPHA LIPOIC ACID PO Take 1 tablet by mouth daily.    . Ascorbic Acid (VITAMIN C) 1000 MG tablet Take 1,000 mg by mouth daily.    . fish oil-omega-3 fatty acids 1000 MG capsule Take 1 g by mouth daily.    Marland Kitchen. L-LYSINE PO Take 1 tablet by mouth daily.    Marland Kitchen. lactobacillus acidophilus (BACID) TABS Take 1 tablet by mouth daily.    . Multiple Vitamins-Minerals (MULTIVITAMINS THER. W/MINERALS) TABS Take 1 tablet by mouth daily.     No current facility-administered medications for this visit.     Patient confirms/reports the following allergies:  Allergies  Allergen Reactions  . Niacin And Related Hives  . Valium [Diazepam] Hives    No orders of the defined types were placed in this encounter.   AUTHORIZATION INFORMATION Primary Insurance: medicare,  ID #: [id number] Pre-Cert / Berkley HarveyAuth required- no   SCHEDULE  INFORMATION: Procedure has been scheduled as follows:  Date: 03/06/18, Time: 10:30 Location: [facility] Dr.Fields  This Gastroenterology Pre-Precedure Review Form is being routed to the following provider(s): Lewie LoronAnna Boone NP

## 2018-02-02 NOTE — Patient Instructions (Signed)
SPLIT MOVIPREP INSTRUCTION SHEET  Please notify us immediately if you are diabetic, take iron supplements, or if you are on coumadin or any blood thinners.  Patient Name:  Courtney Hayes Date of procedure:  03/06/18 Time to register at [facility]:  9:30 Provider:  Dr. Lynn Ito will need to purchase 1 fleet enema     03/05/18 1 Day prior to procedure:     CLEAR LIQUIDS ALL DAY--NO SOLID FOODS!    At 5:00 PM Begin the prep as follows:     Empty 1 pouch A and 1 pouch B into disposable container.  Add lukewarm drinking water to the top line of the container.  Mix to dissolve.  You can mix solution ahead of time & refrigerate prior to drinking.  The solution should be used within 24 hours.  The container is divided by 4 marks.  Every 15 minutes, drink the solution down to the next mark (approx 8 oz) until the liter is complete.  Be sure to drink 16 ounces of clear liquid of your choice (this is important to make sure you stay hydrated and the prep works)   [place]  ([place]). You may take heart, blood pressure, or breathing  medications  03/06/18  Day of Procedure      Four hours before your procedure at 5:30 complete prep as follows:  Empty 1 pouch A and 1 pouch B into disposable container.  Add lukewarm drinking water to the top line of the container.  Mix to dissolve.  You can mix solution ahead of time & refrigerate prior to drinking.  The solution should be used within 24 hours.  The container is divided by 4 marks.  Every 15 minutes, drink the solution down to the next mark (approx 8 oz) until the liter is complete.  You must complete the entire prep to ensure the most effective cleaning.  Be sure to drink 16 ounces of clear liquid of your choice (this is important to make sure you stay hydrated and the prep works)  Should complete within 1 and 1/2 hours.   NOTHING TO EAT OR DRINK AFTER 8:30AM (2 hours before  your procedure)  Give yourself one Fleet enema about 1 hour prior to leaving for the hospital.  You may take TYLENOL products.  Please continue your regular medications unless we have instructed you otherwise.     Please note, on the day of your procedure you MUST be accompanied by an adult who is willing to assume responsibility for you at time of discharge. If you do not have such person with you, your procedure will have to be rescheduled.                                                                                                                     Please leave ALL jewelry at home prior to coming to the hospital for your procedure.   *It is  your responsibility to check with your insurance company for the benefits of coverage you have for this procedure. Unfortunately, not all insurance companies have benefits to cover all or part of these types of procedures. It is your responsibility to check your benefits, however we will be glad to assist you with any codes your insurance company may need.   Please note that most insurance companies will not cover a screening colonoscopy for people under the age of 60  For example, with some insurance companies you may have benefits for a screening colonoscopy, but if polyps are found the diagnosis will change and then you may have a deductible that will need to be met. Please make sure you check your benefits for screening colonoscopy as well as a diagnostic colonoscopy.   CLEAR LIQUIDS: (NO RED) Jello Apple Juice  White Grape Juice Water Banana popsicles  Kool-Aid  Coffee(No cream or milk) Tea (No cream or milk) Soft drinks Broth (fat free beef/chicken/vegetable)  Clear liquids allow you to see your fingers on the other side of the glass.  Be sure they are NOT RED in color, cloudy, but CLEAR.  Do Not Eat: Dairy products of any kind Cranberry juice Tomato or V8 Juice  Orange Juice Grapefruit Juice  Red Grape Juice Solid foods like cereal,  oatmeal, yogurt, fruits, vegetables, creamed soups, eggs, bread, etc   HELPFUL HINTS TO MAKE DRINKING EASIER: -Make sure prep is extremely COLD.  Refrigerate the night before.  You may also put in freezer. -You may try mixing Crystal Light or Country Time Lemonade if you prefer.  MIx in small amounts.  Add more if necessary. -Trying drinking through a straw. -Rinse mouth with water or mouthwash between glasses to remove aftertaste. -Try sipping on a cold beverage/ice popsicles between glasses of prep. -Place a piece of sugar-free hard candy in mouth between glasses. -If you become nauseated, try consuming smaller amounts or stretch out the time between glasses.  Stop for 30 minutes to an hour & slowly start back drinking.  Call our office with any questions or concerns at [phone].  Thank You

## 2018-02-08 NOTE — Progress Notes (Signed)
Appropriate.

## 2018-03-02 ENCOUNTER — Telehealth: Payer: Self-pay | Admitting: Gastroenterology

## 2018-03-02 NOTE — Telephone Encounter (Signed)
Tried to call [facility], had to leave a message, left rx information on voicemail and asked them to call her when it is ready. Also left our number if there were any problems or concerns.

## 2018-03-02 NOTE — Telephone Encounter (Signed)
Pt is scheduled for procedure on Monday and she said [facility] doesn't have anything on her about needing a prep prescription. [facility] [address] [facility] and we have a receipt of confirmation, but I would let the nurse be aware.

## 2018-03-03 NOTE — Telephone Encounter (Signed)
Spoke with the pt, she hasnt heard from the pharmacy and has tried to call them, but has been on hold. She is going to go to the pharmacy and will call me back if she has any problems.

## 2018-03-06 ENCOUNTER — Other Ambulatory Visit: Payer: Self-pay

## 2018-03-06 ENCOUNTER — Ambulatory Visit (HOSPITAL_COMMUNITY)
Admission: RE | Admit: 2018-03-06 | Discharge: 2018-03-06 | Disposition: A | Discharge status: Home / Self Care | Payer: Medicare Other | Source: Ambulatory Visit | Attending: Gastroenterology | Admitting: Gastroenterology

## 2018-03-06 ENCOUNTER — Encounter (HOSPITAL_COMMUNITY)
Admission: RE | Disposition: A | Discharge status: Home / Self Care | Payer: Self-pay | Source: Ambulatory Visit | Attending: Gastroenterology

## 2018-03-06 ENCOUNTER — Encounter (HOSPITAL_COMMUNITY): Payer: Self-pay | Admitting: *Deleted

## 2018-03-06 DIAGNOSIS — Z860100 Personal history of colon polyps, unspecified: Secondary | ICD-10-CM

## 2018-03-06 DIAGNOSIS — K219 Gastro-esophageal reflux disease without esophagitis: Secondary | ICD-10-CM | POA: Insufficient documentation

## 2018-03-06 DIAGNOSIS — Z1211 Encounter for screening for malignant neoplasm of colon: Secondary | ICD-10-CM | POA: Diagnosis not present

## 2018-03-06 DIAGNOSIS — Q438 Other specified congenital malformations of intestine: Secondary | ICD-10-CM | POA: Diagnosis not present

## 2018-03-06 DIAGNOSIS — K648 Other hemorrhoids: Secondary | ICD-10-CM | POA: Insufficient documentation

## 2018-03-06 DIAGNOSIS — Z79899 Other long term (current) drug therapy: Secondary | ICD-10-CM | POA: Diagnosis not present

## 2018-03-06 HISTORY — PX: COLONOSCOPY: SHX5424

## 2018-03-06 SURGERY — COLONOSCOPY
Anesthesia: Moderate Sedation

## 2018-03-06 MED ORDER — MEPERIDINE HCL 100 MG/ML IJ SOLN
INTRAMUSCULAR | Status: AC
  Filled 2018-03-06: qty 2

## 2018-03-06 MED ORDER — MIDAZOLAM HCL 5 MG/5ML IJ SOLN
INTRAMUSCULAR | Status: DC | PRN
  Administered 2018-03-06: 2 mg via INTRAVENOUS
  Administered 2018-03-06: 1 mg via INTRAVENOUS
  Administered 2018-03-06: 2 mg via INTRAVENOUS

## 2018-03-06 MED ORDER — SODIUM CHLORIDE 0.9 % IV SOLN
INTRAVENOUS | Status: DC
  Administered 2018-03-06: 10:00:00 via INTRAVENOUS

## 2018-03-06 MED ORDER — MIDAZOLAM HCL 5 MG/5ML IJ SOLN
INTRAMUSCULAR | Status: AC
  Filled 2018-03-06: qty 10

## 2018-03-06 MED ORDER — STERILE WATER FOR IRRIGATION IR SOLN
Status: DC | PRN
  Administered 2018-03-06: 100 mL

## 2018-03-06 MED ORDER — MEPERIDINE HCL 100 MG/ML IJ SOLN
INTRAMUSCULAR | Status: DC | PRN
  Administered 2018-03-06 (×2): 25 mg via INTRAVENOUS

## 2018-03-06 NOTE — Op Note (Signed)
[name] Patient Name: Courtney Hayes Procedure Date: 03/06/2018 10:00 AM MRN: [id number] Date of Birth: [birth year] Attending MD: [name], MD CSN: [id number] Age: 72 Admit Type: Outpatient Procedure:                Colonoscopy, SURVEILLANCE Indications:              Personal history of colonic polyps-LAST TCS 2014-no                            polyps, SMALL IH Providers:                [name], MD, Edrick Kins, RN, Edythe Clarity,                            Technician Referring MD:             Carylon Perches Medicines:                Meperidine 50 mg IV, Midazolam 5 mg IV Complications:            No immediate complications. Estimated Blood Loss:     Estimated blood loss: none. Procedure:                Pre-Anesthesia Assessment:                           - Prior to the procedure, a History and Physical                            was performed, and patient medications and                            allergies were reviewed. The patient's tolerance of                            previous anesthesia was also reviewed. The risks                            and benefits of the procedure and the sedation                            options and risks were discussed with the patient.                            All questions were answered, and informed consent                            was obtained. Prior Anticoagulants: The patient has                            taken no previous anticoagulant or antiplatelet                            agents. ASA Grade Assessment: II - A patient with  mild systemic disease. After reviewing the risks                            and benefits, the patient was deemed in                            satisfactory condition to undergo the procedure.                            After obtaining informed consent, the colonoscope                            was passed under direct vision. Throughout the                             procedure, the patient's blood pressure, pulse, and                            oxygen saturations were monitored continuously. The                            EC-[id number] ([id number]) scope was introduced through                            the anus and advanced to the the cecum, identified                            by appendiceal orifice and ileocecal valve. The                            colonoscopy was technically difficult and complex                            due to a tortuous colon. Successful completion of                            the procedure was aided by increasing the dose of                            sedation medication, straightening and shortening                            the scope to obtain bowel loop reduction and                            COLOWRAP. The patient tolerated the procedure                            fairly well. The quality of the bowel preparation                            was excellent. The ileocecal valve, appendiceal  orifice, and rectum were photographed. Scope In: 10:22:32 AM Scope Out: 10:37:09 AM Scope Withdrawal Time: 0 hours 9 minutes 44 seconds  Total Procedure Duration: 0 hours 14 minutes 37 seconds  Findings:      The recto-sigmoid colon, sigmoid colon and descending colon were       moderately tortuous.      The exam was otherwise without abnormality.      Internal hemorrhoids were found during retroflexion. The hemorrhoids       were moderate. Impression:               - Tortuous LEFT colon.                           - The examination was otherwise normal.                           - Internal hemorrhoids. Moderate Sedation:      Moderate (conscious) sedation was administered by the endoscopy nurse       and supervised by the endoscopist. The following parameters were       monitored: oxygen saturation, heart rate, blood pressure, and response       to care. Total physician intraservice time was 28  minutes. Recommendation:           - Repeat colonoscopy in 5 years for surveillance.                           - High fiber diet.                           - Continue present medications.                           - Patient has a contact number available for                            emergencies. The signs and symptoms of potential                            delayed complications were discussed with the                            patient. Return to normal activities tomorrow.                            Written discharge instructions were provided to the                            patient. Procedure Code(s):        --- Professional ---                           [phone], Colonoscopy, flexible; diagnostic, including                            collection of specimen(s) by brushing or washing,  when performed (separate procedure)                           99152, Moderate sedation services provided by the                            same physician or other qualified health care                            professional performing the diagnostic or                            therapeutic service that the sedation supports,                            requiring the presence of an independent trained                            observer to assist in the monitoring of the                            patient's level of consciousness and physiological                            status; initial 15 minutes of intraservice time,                            patient age 69 years or older                           [phone], Moderate sedation services; each additional                            15 minutes intraservice time Diagnosis Code(s):        --- Professional ---                           K64.8, Other hemorrhoids                           Z86.010, Personal history of colonic polyps                           Q43.8, Other specified congenital malformations of                             intestine CPT copyright 2016 American [employer]. All rights reserved. The codes documented in this report are preliminary and upon coder review may  be revised to meet current compliance requirements. Jonette EvaSandi Skyrah Krupp, MD Jonette EvaSandi Kamelia Lampkins MD, MD 03/06/2018 10:45:05 AM This report has been signed electronically. Number of Addenda: 0

## 2018-03-06 NOTE — Discharge Instructions (Signed)
You have SMALL internal hemorrhoids. YOU DID NOT HAVE ANY POLYPS. ° ° °DRINK WATER TO KEEP YOUR URINE LIGHT YELLOW. ° °FOLLOW A HIGH FIBER DIET. AVOID ITEMS THAT CAUSE BLOATING. SEE INFO BELOW. ° °USE PREPARATION H FOUR TIMES  A DAY IF NEEDED TO RELIEVE RECTAL PAIN/PRESSURE/BLEEDING. ° °Next colonoscopy in 5 years. ° °Colonoscopy °Care After °Read the instructions outlined below and refer to this sheet in the next week. These discharge instructions provide you with general information on caring for yourself after you leave the hospital. While your treatment has been planned according to the most current medical practices available, unavoidable complications occasionally occur. If you have any problems or questions after discharge, call DR. Hedda Crumbley, [phone]. ° °ACTIVITY °· You may resume your regular activity, but move at a slower pace for the next 24 hours.  °· Take frequent rest periods for the next 24 hours.  °· Walking will help get rid of the air and reduce the bloated feeling in your belly (abdomen).  °· No driving for 24 hours (because of the medicine (anesthesia) used during the test).  °· You may shower.  °· Do not sign any important legal documents or operate any machinery for 24 hours (because of the anesthesia used during the test).  °·  °NUTRITION °· Drink plenty of fluids.  °· You may resume your normal diet as instructed by your doctor.  °· Begin with a light meal and progress to your normal diet. Heavy or fried foods are harder to digest and may make you feel sick to your stomach (nauseated).  °· Avoid alcoholic beverages for 24 hours or as instructed.  °·  °MEDICATIONS °· You may resume your normal medications. °·  °WHAT YOU CAN EXPECT TODAY °· Some feelings of bloating in the abdomen.  °· Passage of more gas than usual.  °· Spotting of blood in your stool or on the toilet paper °· .  °IF YOU HAD POLYPS REMOVED DURING THE COLONOSCOPY: °· Eat a soft diet IF YOU HAVE NAUSEA, BLOATING, ABDOMINAL  PAIN, OR VOMITING. °·   °FINDING OUT THE RESULTS OF YOUR TEST °Not all test results are available during your visit. DR. Cristy Colmenares WILL CALL YOU WITHIN 14 DAYS OF YOUR PROCEDUE WITH YOUR RESULTS. Do not assume everything is normal if you have not heard from DR. Jazleen Robeck, CALL HER OFFICE AT [phone]. ° °SEEK IMMEDIATE MEDICAL ATTENTION AND CALL THE OFFICE: [phone] IF: °· You have more than a spotting of blood in your stool.  °· Your belly is swollen (abdominal distention).  °· You are nauseated or vomiting.  °· You have a temperature over 101F.  °· You have abdominal pain or discomfort that is severe or gets worse throughout the day. ° °High-Fiber Diet °A high-fiber diet changes your normal diet to include more whole grains, legumes, fruits, and vegetables. Changes in the diet involve replacing refined carbohydrates with unrefined foods. The calorie level of the diet is essentially unchanged. The Dietary Reference Intake (recommended amount) for adult males is 38 grams per day. For adult females, it is 25 grams per day. Pregnant and lactating women should consume 28 grams of fiber per day. °Fiber is the intact part of a plant that is not broken down during digestion. Functional fiber is fiber that has been isolated from the plant to provide a beneficial effect in the body. °PURPOSE °· Increase stool bulk.  °· Ease and regulate bowel movements.  °· Lower cholesterol.  °· REDUCE RISK OF COLON CANCER ° °  INDICATIONS THAT YOU NEED MORE FIBER °· Constipation and hemorrhoids.  °· Uncomplicated diverticulosis (intestine condition) and irritable bowel syndrome.  °· Weight management.  °· As a protective measure against hardening of the arteries (atherosclerosis), diabetes, and cancer.  ° °GUIDELINES FOR INCREASING FIBER IN THE DIET °· Start adding fiber to the diet slowly. A gradual increase of about 5 more grams (2 slices of whole-wheat bread, 2 servings of most fruits or vegetables, or 1 bowl of high-fiber cereal) per  day is best. Too rapid an increase in fiber may result in constipation, flatulence, and bloating.  °· Drink enough water and fluids to keep your urine clear or pale yellow. Water, juice, or caffeine-free drinks are recommended. Not drinking enough fluid may cause constipation.  °· Eat a variety of high-fiber foods rather than one type of fiber.  °· Try to increase your intake of fiber through using high-fiber foods rather than fiber pills or supplements that contain small amounts of fiber.  °· The goal is to change the types of food eaten. Do not supplement your present diet with high-fiber foods, but replace foods in your present diet.   ° °INCLUDE A VARIETY OF FIBER SOURCES °· Replace refined and processed grains with whole grains, canned fruits with fresh fruits, and incorporate other fiber sources. White rice, white breads, and most bakery goods contain little or no fiber.  °· Brown whole-grain rice, buckwheat oats, and many fruits and vegetables are all good sources of fiber. These include: broccoli, Brussels sprouts, cabbage, cauliflower, beets, sweet potatoes, white potatoes (skin on), carrots, tomatoes, eggplant, squash, berries, fresh fruits, and dried fruits.  °· Cereals appear to be the richest source of fiber. Cereal fiber is found in whole grains and bran. Bran is the fiber-rich outer coat of cereal grain, which is largely removed in refining. In whole-grain cereals, the bran remains. In breakfast cereals, the largest amount of fiber is found in those with "bran" in their names. The fiber content is sometimes indicated on the label.  °· You may need to include additional fruits and vegetables each day.  °· In baking, for 1 cup white flour, you may use the following substitutions:  °· 1 cup whole-wheat flour minus 2 tablespoons.  °· 1/2 cup white flour plus 1/2 cup whole-wheat flour.  ° °Hemorrhoids °Hemorrhoids are dilated (enlarged) veins around the rectum. Sometimes clots will form in the veins. This  makes them swollen and painful. These are called thrombosed hemorrhoids. °Causes of hemorrhoids include: °· Constipation.  °· Straining to have a bowel movement. °·  HEAVY LIFTING ° °HOME CARE INSTRUCTIONS °· Eat a well balanced diet and drink 6 to 8 glasses of water every day to avoid constipation. You may also use a bulk laxative.  °· Avoid straining to have bowel movements.  °· Keep anal area dry and clean.  °· Do not use a donut shaped pillow or sit on the toilet for long periods. This increases blood pooling and pain.  °· Move your bowels when your body has the urge; this will require less straining and will decrease pain and pressure.  ° °

## 2018-03-06 NOTE — H&P (Signed)
Primary Care Physician:  Asencion Noble, MD Primary Gastroenterologist:  Dr. Oneida Alar  Pre-Procedure History & Physical: HPI:  Courtney Hayes is a 72 y.o. female here for  PERSONAL HISTORY OF POLYPS.  Past Medical History:  Diagnosis Date  . Barrett's esophagus   . Cerebral aneurysm rupture ([place]) 04/1988  . GERD (gastroesophageal reflux disease)   . Hx of colonic polyps     Past Surgical History:  Procedure Laterality Date  . CEREBRAL ANEURYSM REPAIR    . COLONOSCOPY    02/09/2007   [id number] sigmoid colon and external hemorrhoids  . COLONOSCOPY WITH ESOPHAGOGASTRODUODENOSCOPY (EGD) N/A 02/13/2013   Procedure: COLONOSCOPY WITH ESOPHAGOGASTRODUODENOSCOPY (EGD);  Surgeon: Danie Binder, MD;  Location: [facility];  Service: Endoscopy;  Laterality: N/A;  11:00  . ESOPHAGOGASTRODUODENOSCOPY    02/09/2007   SLF: Normal esophagus without evidence of Barrett's  . FEMORAL ARTERY REPAIR      Prior to Admission medications   Medication Sig Start Date End Date Taking? Authorizing Provider  Alpha Lipoic Acid 200 MG CAPS Take 200 mg by mouth daily.    Yes [provider]  b complex vitamins tablet Take 1 tablet by mouth daily.   Yes [provider]  ferrous gluconate (IRON 27) 240 (27 FE) MG tablet Take 240 mg by mouth every Monday, Wednesday, and Friday.   Yes [provider]  fexofenadine (ALLEGRA) 180 MG tablet Take 180 mg by mouth daily as needed for allergies or rhinitis.   Yes [provider]  fish oil-omega-3 fatty acids 1000 MG capsule Take 1 g by mouth daily.   Yes [provider]  L-Lysine 500 MG CAPS Take 500 mg by mouth daily.    Yes [provider]  lactobacillus acidophilus (BACID) TABS Take 1 tablet by mouth daily.   Yes [provider]  Multiple Vitamins-Minerals (CENTRUM SILVER ADULT 50+ PO) Take 1 tablet by mouth daily.   Yes [provider]  omeprazole (PRILOSEC) 40 MG capsule Take 40 mg by mouth daily.  11/24/17  Yes [provider]  peg 3350 powder (MOVIPREP) 100 g SOLR Take 1 kit (200 g total) by mouth as directed. 02/02/18  Yes Annitta Needs, NP  vitamin C (ASCORBIC ACID) 500 MG tablet Take 500 mg by mouth daily.    Yes [provider]    Allergies as of 02/02/2018 - Review Complete 02/02/2018  Allergen Reaction Noted  . Niacin and related Hives 02/06/2013  . Valium [diazepam] Hives 02/06/2013    Family History  Problem Relation Age of Onset  . Colon cancer Neg Hx     Social History   Socioeconomic History  . Marital status: Married    Spouse name: Not on file  . Number of children: 3  . Years of education: Not on file  . Highest education level: Not on file  Occupational History  . Occupation: unemployed, Masters in [employer]: UNEMPLOYED  Social Needs  . Financial resource strain: Not on file  . Food insecurity:    Worry: Not on file    Inability: Not on file  . Transportation needs:    Medical: Not on file    Non-medical: Not on file  Tobacco Use  . Smoking status: Never Smoker  . Smokeless tobacco: Never Used  Substance and Sexual Activity  . Alcohol use: No  . Drug use: No  . Sexual activity: Not on file  Lifestyle  . Physical activity:  Days per week: Not on file    Minutes per session: Not on file  . Stress: Not on file  Relationships  . Social connections:    Talks on phone: Not on file    Gets together: Not on file    Attends religious service: Not on file    Active member of club or organization: Not on file    Attends meetings of clubs or organizations: Not on file    Relationship status: Not on file  . Intimate partner violence:    Fear of current or ex partner: Not on file    Emotionally abused: Not on file    Physically abused: Not on file    Forced sexual activity: Not on file  Other Topics Concern  . 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 Neck:  Supple; Lungs:  Clear throughout to auscultation.    Heart:  Regular rate and rhythm. Abdomen:  Soft, nontender and nondistended. Normal bowel sounds, without guarding, and without rebound.   Neurologic:  Alert and  oriented x4;  grossly normal neurologically.  Impression/Plan:     PERSONAL HISTORY OF POLYPS.  PLAN: 1. TCS TODAY DISCUSSED PROCEDURE, BENEFITS, & RISKS: < 1% chance of medication reaction, bleeding, perforation, or rupture of spleen/liver.

## 2018-03-08 ENCOUNTER — Encounter (HOSPITAL_COMMUNITY): Payer: Self-pay | Admitting: Gastroenterology

## 2018-05-16 ENCOUNTER — Other Ambulatory Visit (HOSPITAL_COMMUNITY): Payer: Self-pay | Admitting: Internal Medicine

## 2018-05-16 DIAGNOSIS — Z1231 Encounter for screening mammogram for malignant neoplasm of breast: Secondary | ICD-10-CM

## 2018-06-28 ENCOUNTER — Ambulatory Visit (HOSPITAL_COMMUNITY)
Admission: RE | Admit: 2018-06-28 | Discharge: 2018-06-28 | Disposition: A | Discharge status: Home / Self Care | Payer: Medicare Other | Source: Ambulatory Visit | Attending: Internal Medicine | Admitting: Internal Medicine

## 2018-06-28 ENCOUNTER — Other Ambulatory Visit (HOSPITAL_COMMUNITY): Payer: Self-pay | Admitting: Internal Medicine

## 2018-06-28 DIAGNOSIS — Z1231 Encounter for screening mammogram for malignant neoplasm of breast: Secondary | ICD-10-CM | POA: Insufficient documentation

## 2018-08-24 DIAGNOSIS — K219 Gastro-esophageal reflux disease without esophagitis: Secondary | ICD-10-CM | POA: Diagnosis not present

## 2018-08-24 DIAGNOSIS — Z79899 Other long term (current) drug therapy: Secondary | ICD-10-CM | POA: Diagnosis not present

## 2018-08-24 DIAGNOSIS — R001 Bradycardia, unspecified: Secondary | ICD-10-CM | POA: Diagnosis not present

## 2018-08-31 DIAGNOSIS — Z6821 Body mass index (BMI) 21.0-21.9, adult: Secondary | ICD-10-CM | POA: Diagnosis not present

## 2018-08-31 DIAGNOSIS — M8580 Other specified disorders of bone density and structure, unspecified site: Secondary | ICD-10-CM | POA: Diagnosis not present

## 2018-08-31 DIAGNOSIS — K219 Gastro-esophageal reflux disease without esophagitis: Secondary | ICD-10-CM | POA: Diagnosis not present

## 2018-08-31 DIAGNOSIS — Z8679 Personal history of other diseases of the circulatory system: Secondary | ICD-10-CM | POA: Diagnosis not present

## 2018-08-31 DIAGNOSIS — R001 Bradycardia, unspecified: Secondary | ICD-10-CM | POA: Diagnosis not present

## 2019-05-22 ENCOUNTER — Other Ambulatory Visit (HOSPITAL_COMMUNITY): Payer: Self-pay | Admitting: Internal Medicine

## 2019-05-22 DIAGNOSIS — Z1231 Encounter for screening mammogram for malignant neoplasm of breast: Secondary | ICD-10-CM

## 2019-07-05 ENCOUNTER — Ambulatory Visit (HOSPITAL_COMMUNITY)
Admission: RE | Admit: 2019-07-05 | Discharge: 2019-07-05 | Disposition: A | Discharge status: Home / Self Care | Payer: Medicare Other | Source: Ambulatory Visit | Attending: Internal Medicine | Admitting: Internal Medicine

## 2019-07-05 ENCOUNTER — Other Ambulatory Visit: Payer: Self-pay

## 2019-07-05 DIAGNOSIS — Z1231 Encounter for screening mammogram for malignant neoplasm of breast: Secondary | ICD-10-CM

## 2019-09-13 DIAGNOSIS — K219 Gastro-esophageal reflux disease without esophagitis: Secondary | ICD-10-CM | POA: Diagnosis not present

## 2019-09-13 DIAGNOSIS — M8580 Other specified disorders of bone density and structure, unspecified site: Secondary | ICD-10-CM | POA: Diagnosis not present

## 2019-09-13 DIAGNOSIS — R001 Bradycardia, unspecified: Secondary | ICD-10-CM | POA: Diagnosis not present

## 2019-09-14 DIAGNOSIS — K219 Gastro-esophageal reflux disease without esophagitis: Secondary | ICD-10-CM | POA: Diagnosis not present

## 2019-09-14 DIAGNOSIS — Z79899 Other long term (current) drug therapy: Secondary | ICD-10-CM | POA: Diagnosis not present

## 2019-09-14 DIAGNOSIS — R001 Bradycardia, unspecified: Secondary | ICD-10-CM | POA: Diagnosis not present

## 2019-09-14 DIAGNOSIS — M8580 Other specified disorders of bone density and structure, unspecified site: Secondary | ICD-10-CM | POA: Diagnosis not present

## 2019-09-20 DIAGNOSIS — K219 Gastro-esophageal reflux disease without esophagitis: Secondary | ICD-10-CM | POA: Diagnosis not present

## 2019-09-20 DIAGNOSIS — Z8679 Personal history of other diseases of the circulatory system: Secondary | ICD-10-CM | POA: Diagnosis not present

## 2019-09-20 DIAGNOSIS — M8580 Other specified disorders of bone density and structure, unspecified site: Secondary | ICD-10-CM | POA: Diagnosis not present

## 2020-02-07 DIAGNOSIS — Z23 Encounter for immunization: Secondary | ICD-10-CM | POA: Diagnosis not present

## 2020-03-07 DIAGNOSIS — Z23 Encounter for immunization: Secondary | ICD-10-CM | POA: Diagnosis not present

## 2020-06-04 ENCOUNTER — Other Ambulatory Visit (HOSPITAL_COMMUNITY): Payer: Self-pay | Admitting: Internal Medicine

## 2020-06-04 DIAGNOSIS — Z1231 Encounter for screening mammogram for malignant neoplasm of breast: Secondary | ICD-10-CM

## 2020-07-07 ENCOUNTER — Ambulatory Visit (HOSPITAL_COMMUNITY)
Admission: RE | Admit: 2020-07-07 | Discharge: 2020-07-07 | Disposition: A | Discharge status: Home / Self Care | Payer: Medicare Other | Source: Ambulatory Visit | Attending: Internal Medicine | Admitting: Internal Medicine

## 2020-07-07 ENCOUNTER — Other Ambulatory Visit: Payer: Self-pay

## 2020-07-07 DIAGNOSIS — Z1231 Encounter for screening mammogram for malignant neoplasm of breast: Secondary | ICD-10-CM | POA: Insufficient documentation

## 2020-09-24 ENCOUNTER — Ambulatory Visit (HOSPITAL_COMMUNITY)
Admission: RE | Admit: 2020-09-24 | Discharge: 2020-09-24 | Disposition: A | Discharge status: Home / Self Care | Payer: Medicare Other | Source: Ambulatory Visit | Attending: Internal Medicine | Admitting: Internal Medicine

## 2020-09-24 ENCOUNTER — Other Ambulatory Visit (HOSPITAL_COMMUNITY): Payer: Self-pay | Admitting: Internal Medicine

## 2020-09-24 ENCOUNTER — Other Ambulatory Visit: Payer: Self-pay

## 2020-09-24 DIAGNOSIS — M7731 Calcaneal spur, right foot: Secondary | ICD-10-CM | POA: Diagnosis not present

## 2020-09-24 DIAGNOSIS — M25561 Pain in right knee: Secondary | ICD-10-CM

## 2020-09-24 DIAGNOSIS — M79671 Pain in right foot: Secondary | ICD-10-CM

## 2020-10-16 ENCOUNTER — Ambulatory Visit: Payer: Medicare Other | Attending: Internal Medicine

## 2020-10-16 DIAGNOSIS — Z23 Encounter for immunization: Secondary | ICD-10-CM

## 2020-10-16 NOTE — Progress Notes (Signed)
   Covid-19 Vaccination [facility]  Name:  Courtney Hayes    MRN: [id number] DOB: 21-Jan-1946  10/16/2020  Courtney Hayes was observed post Covid-19 immunization for 15 minutes without incident. She was provided with Vaccine Information Sheet and instruction to access the V-Safe system.   Courtney Hayes was instructed to call 911 with any severe reactions post vaccine: Marland Kitchen Difficulty breathing  . Swelling of face and throat  . A fast heartbeat  . A bad rash all over body  . Dizziness and weakness

## 2021-05-25 ENCOUNTER — Other Ambulatory Visit (HOSPITAL_COMMUNITY): Payer: Self-pay | Admitting: Internal Medicine

## 2021-05-25 DIAGNOSIS — Z1231 Encounter for screening mammogram for malignant neoplasm of breast: Secondary | ICD-10-CM

## 2021-07-09 ENCOUNTER — Ambulatory Visit (HOSPITAL_COMMUNITY)
Admission: RE | Admit: 2021-07-09 | Discharge: 2021-07-09 | Disposition: A | Discharge status: Home / Self Care | Payer: Medicare Other | Source: Ambulatory Visit | Attending: Internal Medicine | Admitting: Internal Medicine

## 2021-07-09 ENCOUNTER — Other Ambulatory Visit: Payer: Self-pay

## 2021-07-09 DIAGNOSIS — Z1231 Encounter for screening mammogram for malignant neoplasm of breast: Secondary | ICD-10-CM | POA: Diagnosis not present

## 2021-09-04 DIAGNOSIS — R001 Bradycardia, unspecified: Secondary | ICD-10-CM | POA: Diagnosis not present

## 2021-09-04 DIAGNOSIS — M8580 Other specified disorders of bone density and structure, unspecified site: Secondary | ICD-10-CM | POA: Diagnosis not present

## 2021-09-04 DIAGNOSIS — K219 Gastro-esophageal reflux disease without esophagitis: Secondary | ICD-10-CM | POA: Diagnosis not present

## 2021-09-04 DIAGNOSIS — Z79899 Other long term (current) drug therapy: Secondary | ICD-10-CM | POA: Diagnosis not present

## 2021-09-11 ENCOUNTER — Other Ambulatory Visit (HOSPITAL_COMMUNITY): Payer: Self-pay | Admitting: Internal Medicine

## 2021-09-11 DIAGNOSIS — Z6821 Body mass index (BMI) 21.0-21.9, adult: Secondary | ICD-10-CM | POA: Diagnosis not present

## 2021-09-11 DIAGNOSIS — K219 Gastro-esophageal reflux disease without esophagitis: Secondary | ICD-10-CM | POA: Diagnosis not present

## 2021-09-11 DIAGNOSIS — I609 Nontraumatic subarachnoid hemorrhage, unspecified: Secondary | ICD-10-CM | POA: Diagnosis not present

## 2021-09-11 DIAGNOSIS — R001 Bradycardia, unspecified: Secondary | ICD-10-CM | POA: Diagnosis not present

## 2021-09-11 DIAGNOSIS — R4189 Other symptoms and signs involving cognitive functions and awareness: Secondary | ICD-10-CM

## 2021-09-11 DIAGNOSIS — Z23 Encounter for immunization: Secondary | ICD-10-CM | POA: Diagnosis not present

## 2021-09-24 ENCOUNTER — Other Ambulatory Visit: Payer: Self-pay

## 2021-09-24 ENCOUNTER — Encounter (HOSPITAL_COMMUNITY): Payer: Self-pay

## 2021-09-24 ENCOUNTER — Ambulatory Visit (HOSPITAL_COMMUNITY)
Admission: RE | Admit: 2021-09-24 | Discharge: 2021-09-24 | Disposition: A | Discharge status: Home / Self Care | Payer: Medicare Other | Source: Ambulatory Visit | Attending: Internal Medicine | Admitting: Internal Medicine

## 2021-09-24 DIAGNOSIS — R4189 Other symptoms and signs involving cognitive functions and awareness: Secondary | ICD-10-CM

## 2021-10-06 ENCOUNTER — Other Ambulatory Visit (HOSPITAL_COMMUNITY): Payer: Self-pay | Admitting: Internal Medicine

## 2021-10-06 DIAGNOSIS — R4189 Other symptoms and signs involving cognitive functions and awareness: Secondary | ICD-10-CM

## 2021-10-06 DIAGNOSIS — R413 Other amnesia: Secondary | ICD-10-CM

## 2021-10-08 ENCOUNTER — Other Ambulatory Visit: Payer: Self-pay

## 2021-10-08 ENCOUNTER — Ambulatory Visit (HOSPITAL_COMMUNITY)
Admission: RE | Admit: 2021-10-08 | Discharge: 2021-10-08 | Disposition: A | Discharge status: Home / Self Care | Payer: Medicare Other | Source: Ambulatory Visit | Attending: Internal Medicine | Admitting: Internal Medicine

## 2021-10-08 DIAGNOSIS — G3184 Mild cognitive impairment, so stated: Secondary | ICD-10-CM | POA: Diagnosis not present

## 2021-10-08 DIAGNOSIS — R4189 Other symptoms and signs involving cognitive functions and awareness: Secondary | ICD-10-CM | POA: Insufficient documentation

## 2021-10-08 DIAGNOSIS — R413 Other amnesia: Secondary | ICD-10-CM | POA: Diagnosis not present

## 2021-10-08 DIAGNOSIS — I671 Cerebral aneurysm, nonruptured: Secondary | ICD-10-CM | POA: Diagnosis not present

## 2021-10-13 DIAGNOSIS — R431 Parosmia: Secondary | ICD-10-CM | POA: Diagnosis not present

## 2022-01-07 DIAGNOSIS — H52203 Unspecified astigmatism, bilateral: Secondary | ICD-10-CM | POA: Diagnosis not present

## 2022-01-07 DIAGNOSIS — H2511 Age-related nuclear cataract, right eye: Secondary | ICD-10-CM | POA: Diagnosis not present

## 2022-01-07 DIAGNOSIS — H524 Presbyopia: Secondary | ICD-10-CM | POA: Diagnosis not present

## 2022-01-07 DIAGNOSIS — Z961 Presence of intraocular lens: Secondary | ICD-10-CM | POA: Diagnosis not present

## 2022-01-07 DIAGNOSIS — H5213 Myopia, bilateral: Secondary | ICD-10-CM | POA: Diagnosis not present

## 2022-01-21 DIAGNOSIS — G3184 Mild cognitive impairment, so stated: Secondary | ICD-10-CM | POA: Diagnosis not present

## 2022-02-15 DIAGNOSIS — E785 Hyperlipidemia, unspecified: Secondary | ICD-10-CM | POA: Diagnosis not present

## 2022-02-15 DIAGNOSIS — K219 Gastro-esophageal reflux disease without esophagitis: Secondary | ICD-10-CM | POA: Diagnosis not present

## 2022-02-15 DIAGNOSIS — Z0001 Encounter for general adult medical examination with abnormal findings: Secondary | ICD-10-CM | POA: Diagnosis not present

## 2022-03-03 DIAGNOSIS — H2511 Age-related nuclear cataract, right eye: Secondary | ICD-10-CM | POA: Diagnosis not present

## 2022-04-20 DIAGNOSIS — G3184 Mild cognitive impairment, so stated: Secondary | ICD-10-CM | POA: Diagnosis not present

## 2022-04-20 DIAGNOSIS — D519 Vitamin B12 deficiency anemia, unspecified: Secondary | ICD-10-CM | POA: Diagnosis not present

## 2022-07-05 DIAGNOSIS — Z961 Presence of intraocular lens: Secondary | ICD-10-CM | POA: Diagnosis not present

## 2022-07-06 ENCOUNTER — Other Ambulatory Visit (HOSPITAL_COMMUNITY): Payer: Self-pay | Admitting: Internal Medicine

## 2022-07-06 DIAGNOSIS — Z1231 Encounter for screening mammogram for malignant neoplasm of breast: Secondary | ICD-10-CM

## 2022-07-15 ENCOUNTER — Ambulatory Visit (HOSPITAL_COMMUNITY)
Admission: RE | Admit: 2022-07-15 | Discharge: 2022-07-15 | Disposition: A | Discharge status: Home / Self Care | Payer: Medicare Other | Source: Ambulatory Visit | Attending: Internal Medicine | Admitting: Internal Medicine

## 2022-07-15 DIAGNOSIS — Z1231 Encounter for screening mammogram for malignant neoplasm of breast: Secondary | ICD-10-CM | POA: Insufficient documentation

## 2022-07-20 DIAGNOSIS — D519 Vitamin B12 deficiency anemia, unspecified: Secondary | ICD-10-CM | POA: Diagnosis not present

## 2022-08-04 DIAGNOSIS — D519 Vitamin B12 deficiency anemia, unspecified: Secondary | ICD-10-CM | POA: Diagnosis not present

## 2022-08-04 DIAGNOSIS — G3184 Mild cognitive impairment, so stated: Secondary | ICD-10-CM | POA: Diagnosis not present

## 2022-08-04 DIAGNOSIS — I10 Essential (primary) hypertension: Secondary | ICD-10-CM | POA: Diagnosis not present

## 2022-09-14 DIAGNOSIS — K219 Gastro-esophageal reflux disease without esophagitis: Secondary | ICD-10-CM | POA: Diagnosis not present

## 2022-09-14 DIAGNOSIS — G3184 Mild cognitive impairment, so stated: Secondary | ICD-10-CM | POA: Diagnosis not present

## 2022-09-14 DIAGNOSIS — Z79899 Other long term (current) drug therapy: Secondary | ICD-10-CM | POA: Diagnosis not present

## 2022-09-20 ENCOUNTER — Other Ambulatory Visit (HOSPITAL_COMMUNITY): Payer: Self-pay | Admitting: Internal Medicine

## 2022-09-20 DIAGNOSIS — R001 Bradycardia, unspecified: Secondary | ICD-10-CM | POA: Diagnosis not present

## 2022-09-20 DIAGNOSIS — G3184 Mild cognitive impairment, so stated: Secondary | ICD-10-CM | POA: Diagnosis not present

## 2022-09-20 DIAGNOSIS — D519 Vitamin B12 deficiency anemia, unspecified: Secondary | ICD-10-CM | POA: Diagnosis not present

## 2022-09-20 DIAGNOSIS — Z780 Asymptomatic menopausal state: Secondary | ICD-10-CM

## 2022-10-05 IMAGING — DX DG FOOT COMPLETE 3+V*R*
3 series · 3 of 3 positions shown · non-contrast
Comparison: None.

CLINICAL DATA: Pain

EXAM:
RIGHT FOOT COMPLETE - 3+ VIEW

[foot ap]
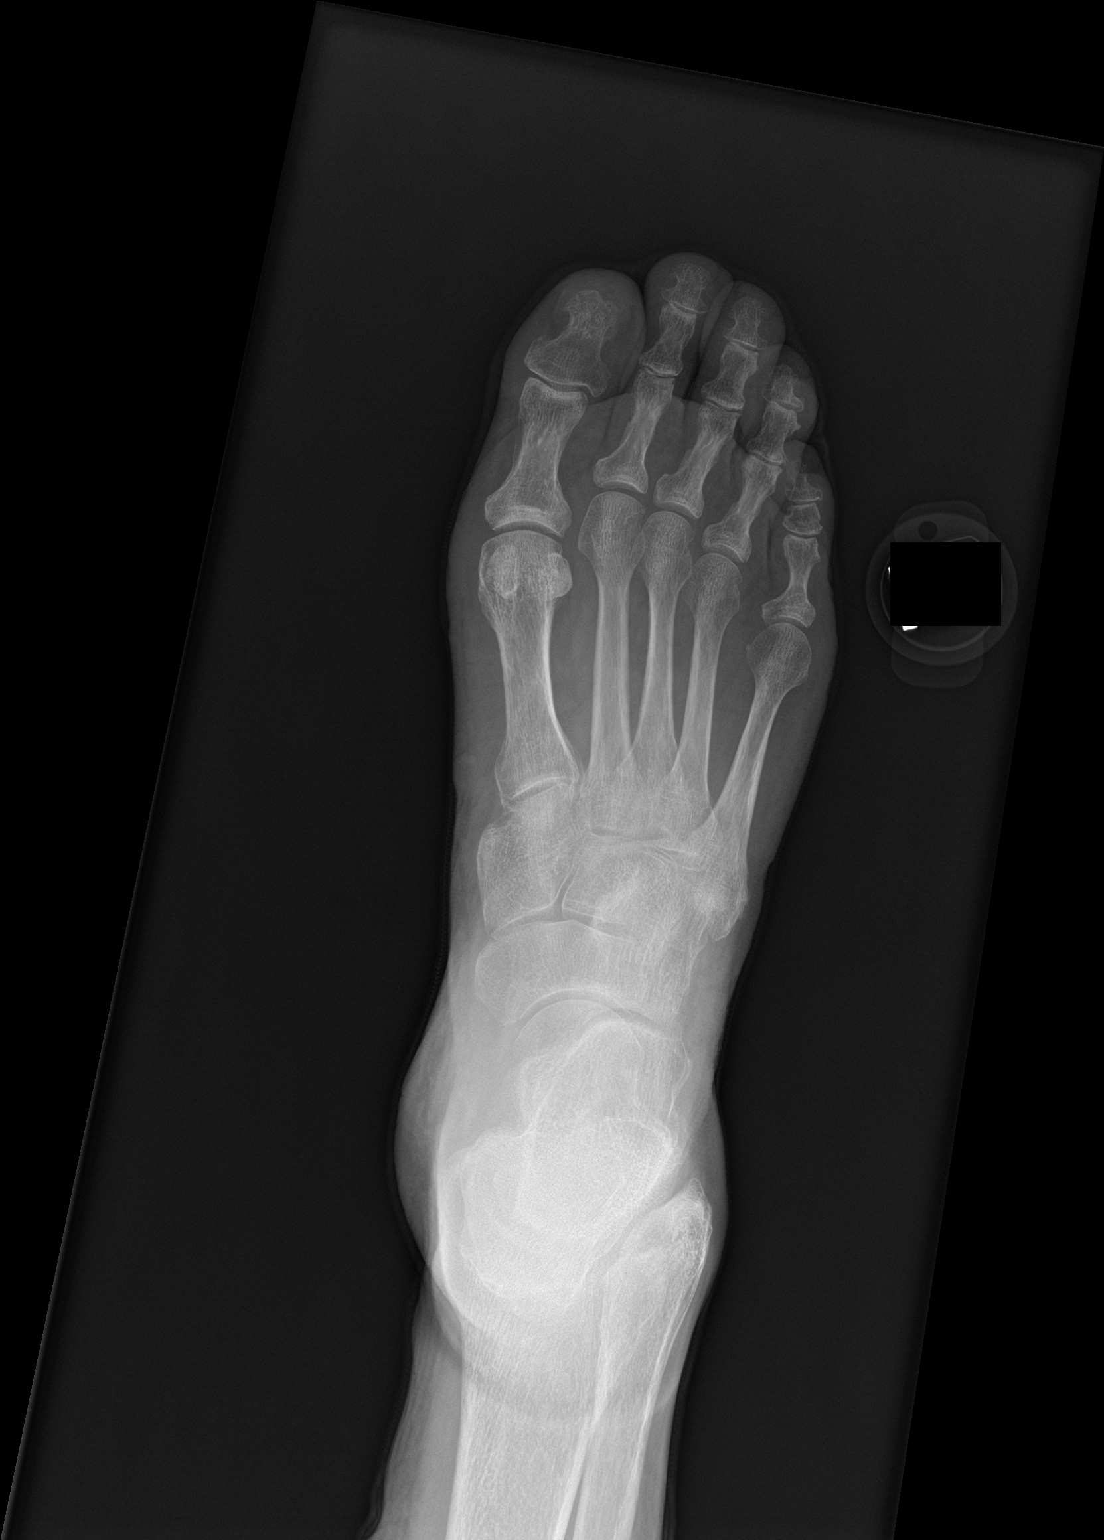

[foot obl]
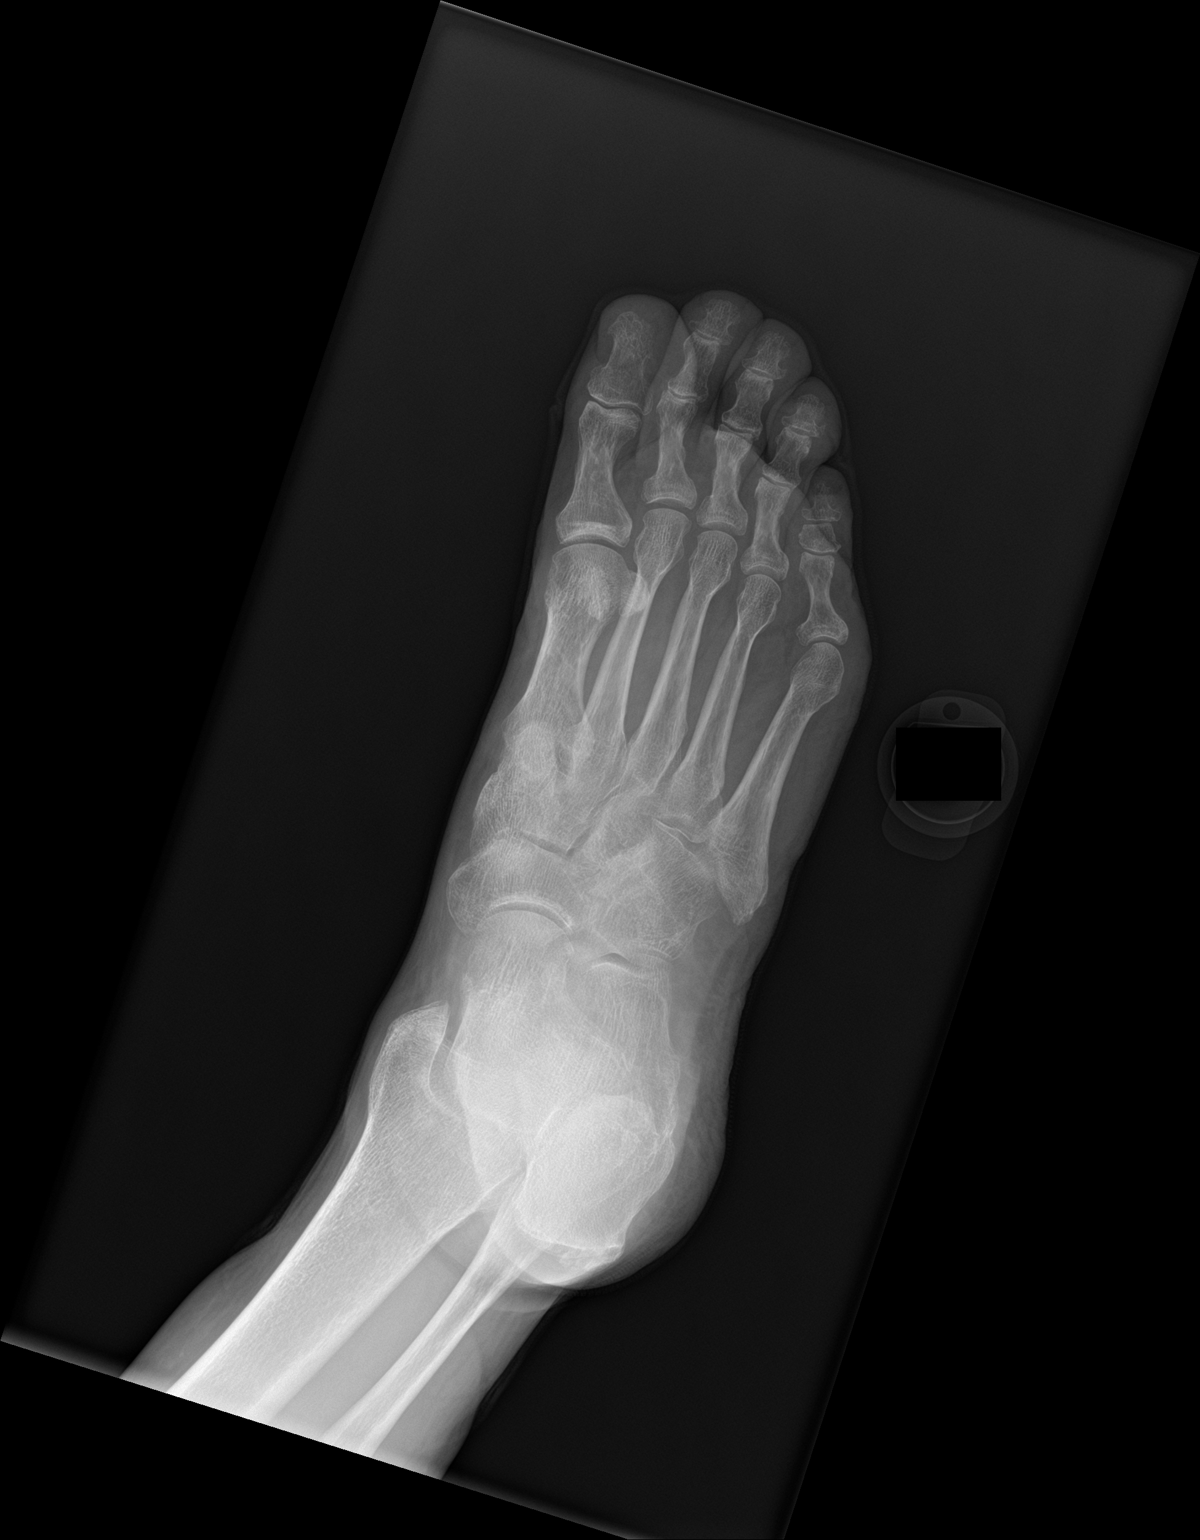

[foot lat]
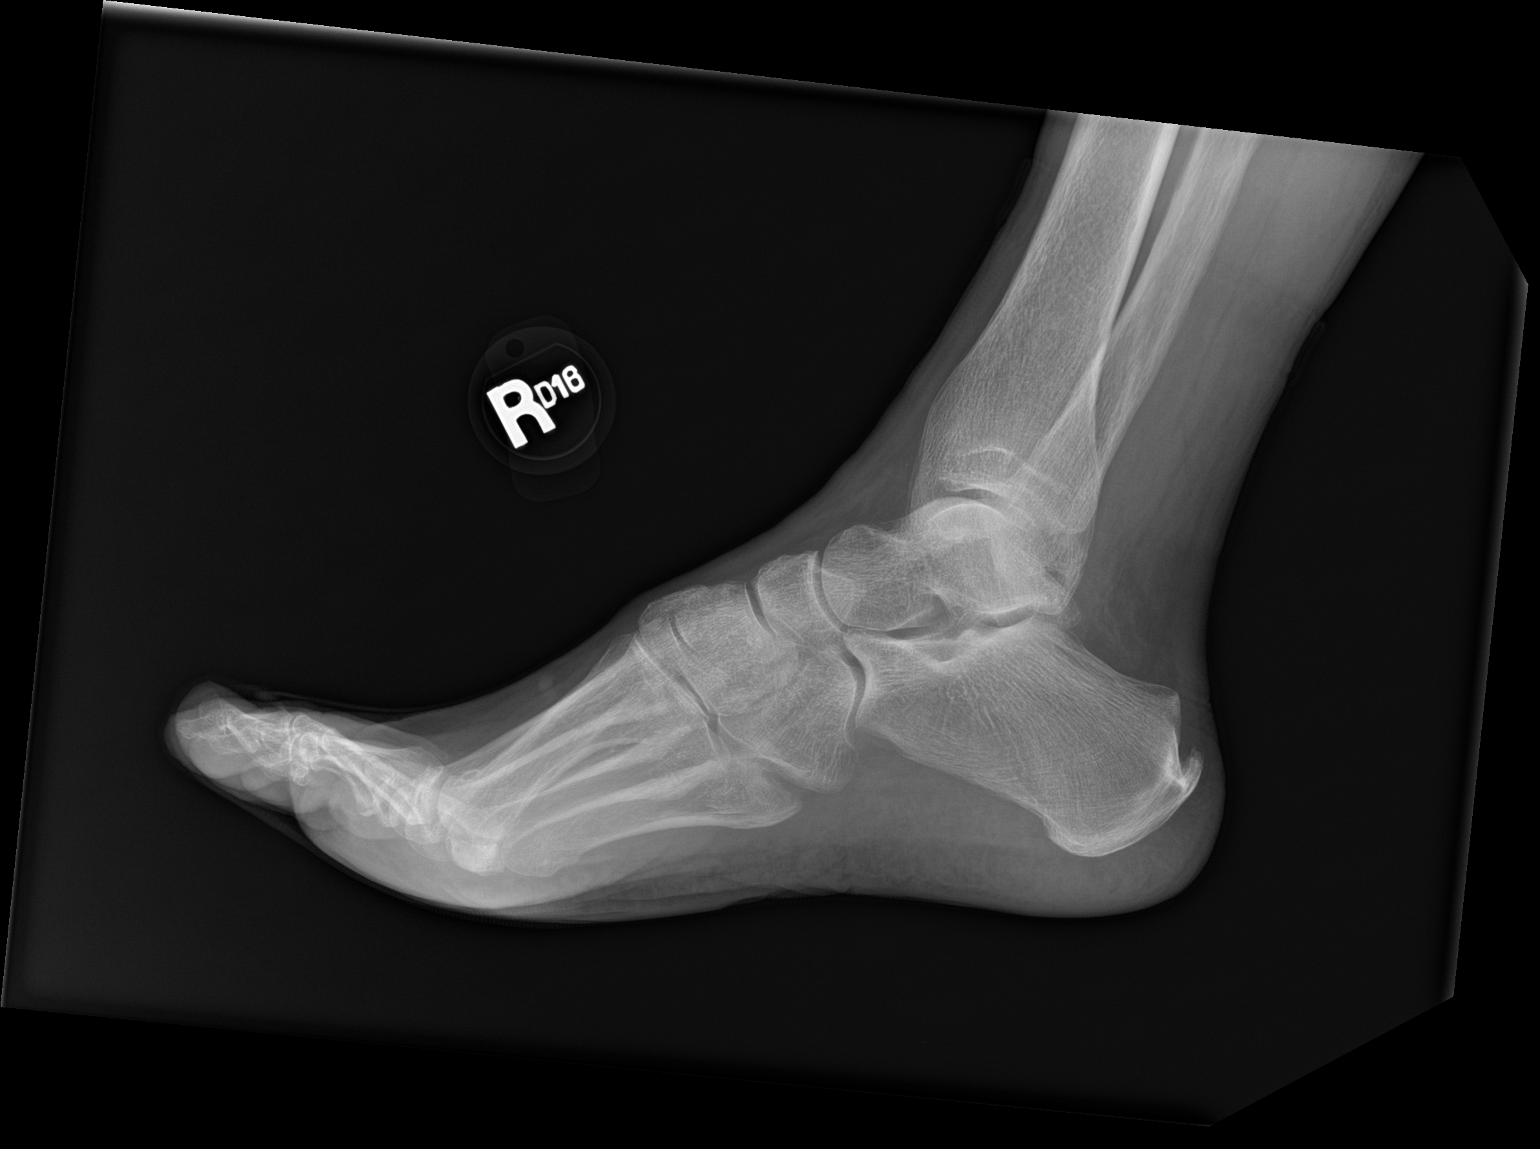

[3 of 3 positions shown; findings below may reference images not displayed]

FINDINGS: Frontal, oblique, and lateral views were obtained. There is no
fracture or dislocation. Joint spaces appear normal. No erosive
change. There is a posterior calcaneal spur.
IMPRESSION: No fracture or dislocation. No appreciable joint space narrowing.
Posterior calcaneal spur noted.

## 2022-10-18 ENCOUNTER — Other Ambulatory Visit (HOSPITAL_COMMUNITY): Payer: Medicare Other

## 2022-11-01 ENCOUNTER — Other Ambulatory Visit (HOSPITAL_COMMUNITY): Payer: Medicare Other

## 2022-11-15 ENCOUNTER — Ambulatory Visit (HOSPITAL_COMMUNITY)
Admission: RE | Admit: 2022-11-15 | Discharge: 2022-11-15 | Disposition: A | Discharge status: Home / Self Care | Payer: Medicare Other | Source: Ambulatory Visit | Attending: Internal Medicine | Admitting: Internal Medicine

## 2022-11-15 DIAGNOSIS — M810 Age-related osteoporosis without current pathological fracture: Secondary | ICD-10-CM | POA: Diagnosis not present

## 2022-11-15 DIAGNOSIS — Z780 Asymptomatic menopausal state: Secondary | ICD-10-CM | POA: Diagnosis not present

## 2023-01-19 ENCOUNTER — Encounter: Payer: Self-pay | Admitting: Gastroenterology

## 2023-01-27 ENCOUNTER — Encounter: Payer: Self-pay | Admitting: *Deleted

## 2023-04-25 ENCOUNTER — Telehealth: Payer: Self-pay | Admitting: *Deleted

## 2023-04-25 NOTE — Telephone Encounter (Signed)
  Procedure: Colonoscopy  Height: 5'2 Weight: 116lbs        Have you had a colonoscopy before?  03/06/18, Dr. Darrick Penna  Do you have family history of colon cancer?  no  Do you have a family history of polyps? no  Previous colonoscopy with polyps removed? no  Do you have a history colorectal cancer?   no  Are you diabetic?  no  Do you have a prosthetic or mechanical heart valve? no  Do you have a pacemaker/defibrillator?   no  Have you had endocarditis/atrial fibrillation?  no  Do you use supplemental oxygen/CPAP?  no  Have you had joint replacement within the last 12 months?  no  Do you tend to be constipated or have to use laxatives?  no   Do you have history of alcohol use? If yes, how much and how often.  no  Do you have history or are you using drugs? If yes, what do are you  using?  no  Have you ever had a stroke/heart attack?  no  Have you ever had a heart or other vascular stent placed,?no  Do you take weight loss medication? no  female patients,: have you had a hysterectomy? no                              are you post menopausal?  yes                              do you still have your menstrual cycle? no    Date of last menstrual period?   Do you take any blood-thinning medications such as: (Plavix, aspirin, Coumadin, Aggrenox, Brilinta, Xarelto, Eliquis, Pradaxa, Savaysa or Effient)? no  If yes we need the name, milligram, dosage and who is prescribing doctor:               Current Outpatient Medications  Medication Sig Dispense Refill   donepezil (ARICEPT) 10 MG tablet Take 10 mg by mouth at bedtime.     ibandronate (BONIVA) 150 MG tablet Take 150 mg by mouth every 30 (thirty) days. Take in the morning with a full glass of water, on an empty stomach, and do not take anything else by mouth or lie down for the next 30 min.     No current facility-administered medications for this visit.    Allergies  Allergen Reactions   Formaldehyde Other (See  Comments)    Preservative allergy   Isothiazolinone Chloride Other (See Comments)    Preservative allergy   Niacin And Related Hives   Quaternium-15 Other (See Comments)    Preservative allergy   Thimerosal (Thiomersal) Other (See Comments)    Preservative allergy   Valium [Diazepam] Hives

## 2023-04-26 NOTE — Telephone Encounter (Signed)
Considering age and mild cognitive impairment, recommend OV.

## 2023-04-27 NOTE — Telephone Encounter (Signed)
Left message asking pt to call office to schedule OV before procedure

## 2023-05-03 ENCOUNTER — Ambulatory Visit: Payer: Medicare Other | Admitting: Gastroenterology

## 2023-05-03 ENCOUNTER — Ambulatory Visit (INDEPENDENT_AMBULATORY_CARE_PROVIDER_SITE_OTHER): Payer: Medicare Other | Admitting: Gastroenterology

## 2023-05-03 ENCOUNTER — Encounter: Payer: Self-pay | Admitting: Gastroenterology

## 2023-05-03 VITALS — BP 113/73 | HR 64 | Temp 97.2°F | Ht 62.0 in | Wt 118.6 lb

## 2023-05-03 DIAGNOSIS — Z86010 Personal history of colonic polyps: Secondary | ICD-10-CM

## 2023-05-03 NOTE — Progress Notes (Signed)
Gastroenterology Office Note     Primary Care Physician:  Carylon Perches, MD  Primary GI: Dr. Marletta Lor    Chief Complaint   Chief Complaint  Patient presents with   Follow-up    Ov before colonoscopy     History of Present Illness   Courtney Hayes is a 77 y.o. female presenting today for an office visit prior to colonoscopy. She has a remote history of colon polyps around 2008. She was brought in due to age.  Subsequent colonoscopies (2014 and 2019 without polyps). She was on the recall list for 5 year surveillance.   No abdominal pain, N/V, changes in bowel habits, constipation, diarrhea, overt GI bleeding, GERD, dysphagia, unexplained weight loss, lack of appetite, unexplained weight gain.    She is in excellent health.  No FH colon cancer or polyps.   Past Medical History:  Diagnosis Date   Cerebral aneurysm rupture (HCC) 04/12/1988   GERD (gastroesophageal reflux disease)    Hx of colonic polyps     Past Surgical History:  Procedure Laterality Date   CEREBRAL ANEURYSM REPAIR     COLONOSCOPY  02/09/2007   [id number]:[id number] sigmoid colon and external hemorrhoids   COLONOSCOPY N/A 03/06/2018   torturous left colon, internal hemorrhoids   COLONOSCOPY WITH ESOPHAGOGASTRODUODENOSCOPY (EGD) N/A 02/13/2013   Procedure: COLONOSCOPY WITH ESOPHAGOGASTRODUODENOSCOPY (EGD);  Surgeon: West Bali, MD;  [facility];  Service: Endoscopy;  Laterality: N/A;  11:00   ESOPHAGOGASTRODUODENOSCOPY  02/09/2007   SLF: Normal esophagus without evidence of Barrett's   FEMORAL ARTERY REPAIR      Current Outpatient Medications  Medication Sig Dispense Refill   donepezil (ARICEPT) 10 MG tablet Take 10 mg by mouth at bedtime.     ibandronate (BONIVA) 150 MG tablet Take 150 mg by mouth every 30 (thirty) days. Take in the morning with a full glass of water, on an empty stomach, and do not take anything else by mouth or lie down for the next 30 min.     No current  facility-administered medications for this visit.    Allergies as of 05/03/2023 - Review Complete 05/03/2023  Allergen Reaction Noted   Formaldehyde Other (See Comments) 02/23/2018   Isothiazolinone chloride Other (See Comments) 02/23/2018   Niacin and related Hives 02/06/2013   Quaternium-15 Other (See Comments) 02/23/2018   Thimerosal (thiomersal) Other (See Comments) 02/23/2018   Valium [diazepam] Hives 02/06/2013    Family History  Problem Relation Age of Onset   Colon cancer Neg Hx    Colon polyps Neg Hx     Social History   Socioeconomic History   Marital status: Married    Spouse name: Not on file   Number of children: 3   Years of education: Not on file   Highest education level: Not on file  Occupational History   Occupation: unemployed, Masters in Landscape architect: UNEMPLOYED  Tobacco Use   Smoking status: Never   Smokeless tobacco: Never  Vaping Use   Vaping Use: Never used  Substance and Sexual Activity   Alcohol use: No   Drug use: No   Sexual activity: Not on file  Other Topics Concern   Not on file  Social History Narrative   Lives w/ husband 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  CV: Denies chest pain, heart palpitations, peripheral edema, syncope.  Resp: Denies shortness of breath at rest or with exertion. Denies wheezing or cough.  GI: Denies dysphagia or odynophagia. Denies jaundice, hematemesis, fecal incontinence. GU : Denies urinary burning, urinary frequency, urinary hesitancy MS: Denies joint pain, muscle weakness, cramps, or limitation of movement.  Derm: Denies rash,  itching, dry skin Psych: Denies depression, anxiety, memory loss, and confusion Heme: Denies bruising, bleeding, and enlarged lymph nodes.   Physical Exam   BP 113/73   Pulse 64   Temp (!) 97.2 F (36.2 C)   Ht 5\' 2"  (1.575 m)   Wt 118 lb 9.6 oz (53.8 kg)   BMI 21.69 kg/m  General:   Alert and oriented. Pleasant and cooperative. Well-nourished and well-developed.  Head:  Normocephalic and atraumatic. Eyes:  Without icterus Ears:  Normal auditory acuity. Lungs:  Clear to auscultation bilaterally.  Heart:  S1, S2 present without murmurs appreciated.  Abdomen:  +BS, soft, non-tender and non-distended. No HSM noted. No guarding or rebound. No masses appreciated.  Rectal:  Deferred  Msk:  Symmetrical without gross deformities. Normal posture. Extremities:  Without edema. Neurologic:  Alert and  oriented x4;  grossly normal neurologically. Skin:  Intact without significant lesions or rashes. Psych:  Alert and cooperative. Normal mood and affect.   Assessment   Courtney Hayes is a 77 y.o. female presenting today for an office visit prior to colonoscopy. She has a remote history of colon polyps around 2008. She was brought in due to age.Subsequent colonoscopies (2014 and 2019 without polyps). She was on the recall list for 5 year surveillance. NO FH colon cancer or polyps.  She has no concerning upper or lower GI signs/symptoms. She is in excellent health. If this colonoscopy is normal, will likely defer any further colonoscopy unless clinical course changes.     PLAN   Proceed with colonoscopy by Dr. Marletta Lor  in near future: the risks, benefits, and alternatives have been discussed with the patient in detail. The patient states understanding and desires to proceed.      Gelene Mink, PhD, ANP-BC [facility]

## 2023-05-03 NOTE — Patient Instructions (Signed)
We are arranging a colonoscopy in the near future with Dr. Marletta Lor!  Further recommendations to follow!  It was a pleasure to see you today. I want to create trusting relationships with patients and provide genuine, compassionate, and quality care. I truly value your feedback, so please be on the lookout for a survey regarding your visit with me today. I appreciate your time in completing this!         Gelene Mink, PhD, ANP-BC [facility]

## 2023-05-04 ENCOUNTER — Encounter: Payer: Self-pay | Admitting: *Deleted

## 2023-05-04 ENCOUNTER — Other Ambulatory Visit: Payer: Self-pay | Admitting: *Deleted

## 2023-05-04 ENCOUNTER — Telehealth: Payer: Self-pay | Admitting: Internal Medicine

## 2023-05-04 MED ORDER — PEG 3350-KCL-NA BICARB-NACL 420 G PO SOLR: 4000.0000 mL | Freq: Once | ORAL | 0 refills | Status: AC

## 2023-05-04 NOTE — Telephone Encounter (Signed)
Pt called to cancel procedure on 06/03/23 due to being out of town. We will call her back to reschedule for July once we get providers schedule. Pt verbalized understanding

## 2023-05-04 NOTE — Telephone Encounter (Signed)
Pt LMOM that she needed to reschedule her colonoscopy. [phone]

## 2023-05-19 NOTE — Telephone Encounter (Signed)
Pt called and left vm ,states that she received papers in the mail and wanted to make sure the colonoscopy was cancelled.   Advised pt that procedure was cancelled and that was probably the instructions that were sent out before she had called. Pt states when she get back from vacation she will call to reschedule.

## 2023-05-23 NOTE — Telephone Encounter (Signed)
[facility] to schedule procedure for July

## 2023-05-26 ENCOUNTER — Encounter: Payer: Self-pay | Admitting: *Deleted

## 2023-06-03 ENCOUNTER — Encounter (HOSPITAL_COMMUNITY): Payer: Self-pay

## 2023-06-03 ENCOUNTER — Ambulatory Visit (HOSPITAL_COMMUNITY): Admit: 2023-06-03 | Payer: Medicare Other

## 2023-06-03 SURGERY — COLONOSCOPY WITH PROPOFOL
Anesthesia: Monitor Anesthesia Care

## 2023-06-06 ENCOUNTER — Other Ambulatory Visit: Payer: Self-pay | Admitting: *Deleted

## 2023-06-06 ENCOUNTER — Encounter: Payer: Self-pay | Admitting: *Deleted

## 2023-06-06 MED ORDER — PEG 3350-KCL-NA BICARB-NACL 420 G PO SOLR: 4000.0000 mL | Freq: Once | ORAL | 0 refills | Status: AC

## 2023-06-06 NOTE — Telephone Encounter (Signed)
Pt called and left vm stating she was back from vacation and ready to schedule her colonoscopy.  Pt has been rescheduled until 07/04/23 at 11:30 am, new instructions sent to pt and prep re-sent to pharmacy due to pt not picking it up the first time.

## 2023-06-07 ENCOUNTER — Encounter: Payer: Self-pay | Admitting: *Deleted

## 2023-06-30 ENCOUNTER — Encounter (HOSPITAL_COMMUNITY)
Admission: RE | Admit: 2023-06-30 | Discharge: 2023-06-30 | Disposition: A | Discharge status: Home / Self Care | Payer: Medicare Other | Source: Ambulatory Visit | Attending: Internal Medicine | Admitting: Internal Medicine

## 2023-06-30 ENCOUNTER — Other Ambulatory Visit: Payer: Self-pay

## 2023-06-30 ENCOUNTER — Encounter (HOSPITAL_COMMUNITY): Payer: Self-pay

## 2023-07-04 ENCOUNTER — Ambulatory Visit (HOSPITAL_COMMUNITY): Payer: Medicare Other | Admitting: Anesthesiology

## 2023-07-04 ENCOUNTER — Ambulatory Visit (HOSPITAL_COMMUNITY)
Admission: RE | Admit: 2023-07-04 | Discharge: 2023-07-04 | Disposition: A | Discharge status: Home / Self Care | Payer: Medicare Other | Source: Home / Self Care | Attending: Internal Medicine | Admitting: Internal Medicine

## 2023-07-04 ENCOUNTER — Encounter (HOSPITAL_COMMUNITY): Payer: Self-pay

## 2023-07-04 ENCOUNTER — Encounter (HOSPITAL_COMMUNITY)
Admission: RE | Disposition: A | Discharge status: Home / Self Care | Payer: Self-pay | Source: Home / Self Care | Attending: Internal Medicine

## 2023-07-04 ENCOUNTER — Ambulatory Visit (HOSPITAL_BASED_OUTPATIENT_CLINIC_OR_DEPARTMENT_OTHER): Payer: Medicare Other | Admitting: Anesthesiology

## 2023-07-04 DIAGNOSIS — Z1211 Encounter for screening for malignant neoplasm of colon: Secondary | ICD-10-CM | POA: Insufficient documentation

## 2023-07-04 DIAGNOSIS — K648 Other hemorrhoids: Secondary | ICD-10-CM | POA: Diagnosis not present

## 2023-07-04 HISTORY — PX: COLONOSCOPY WITH PROPOFOL: SHX5780

## 2023-07-04 SURGERY — COLONOSCOPY WITH PROPOFOL
Anesthesia: General

## 2023-07-04 MED ORDER — PROPOFOL 10 MG/ML IV BOLUS
INTRAVENOUS | Status: DC | PRN
Start: 2023-07-04 — End: 2023-07-04
  Administered 2023-07-04: 100 mg via INTRAVENOUS
  Administered 2023-07-04: 100 ug/kg/min via INTRAVENOUS
  Administered 2023-07-04: 20 mg via INTRAVENOUS
  Administered 2023-07-04: 150 ug/kg/min via INTRAVENOUS
  Administered 2023-07-04: 50 mg via INTRAVENOUS

## 2023-07-04 MED ORDER — LACTATED RINGERS IV SOLN: INTRAVENOUS | Status: DC | PRN

## 2023-07-04 MED ORDER — LIDOCAINE HCL 1 % IJ SOLN
INTRAMUSCULAR | Status: DC | PRN
  Administered 2023-07-04: 50 mg via INTRADERMAL

## 2023-07-04 NOTE — Anesthesia Postprocedure Evaluation (Signed)
Anesthesia Post Note  Patient: Courtney Hayes  Procedure(s) Performed: COLONOSCOPY WITH PROPOFOL  Patient location during evaluation: Phase II Anesthesia Type: General Level of consciousness: awake Pain management: pain level controlled Vital Signs Assessment: post-procedure vital signs reviewed and stable Respiratory status: spontaneous breathing and respiratory function stable Cardiovascular status: blood pressure returned to baseline and stable Postop Assessment: no headache and no apparent nausea or vomiting Anesthetic complications: no Comments: Late entry   No notable events documented.   Last Vitals:  Vitals:   07/04/23 1139 07/04/23 1142  BP: (!) 91/51 99/64  Pulse: (!) 58   Resp: (!) 25 18  Temp: (!) 36.4 C   SpO2: 99% 98%    Last Pain:  Vitals:   07/04/23 1139  TempSrc: Oral  PainSc: Asleep                 Windell Norfolk

## 2023-07-04 NOTE — Anesthesia Preprocedure Evaluation (Signed)
Anesthesia Evaluation  Patient identified by MRN, date of birth, ID band Patient awake    Reviewed: Allergy & Precautions, H&P , NPO status , Patient's Chart, lab work & pertinent test results, reviewed documented beta blocker date and time   Airway Mallampati: II  TM Distance: >3 FB Neck ROM: full    Dental no notable dental hx.    Pulmonary neg pulmonary ROS   Pulmonary exam normal breath sounds clear to auscultation       Cardiovascular Exercise Tolerance: Good negative cardio ROS  Rhythm:regular Rate:Normal     Neuro/Psych negative neurological ROS  negative psych ROS   GI/Hepatic negative GI ROS, Neg liver ROS,,,  Endo/Other  negative endocrine ROS    Renal/GU negative Renal ROS  negative genitourinary   Musculoskeletal   Abdominal   Peds  Hematology negative hematology ROS (+)   Anesthesia Other Findings   Reproductive/Obstetrics negative OB ROS                             Anesthesia Physical Anesthesia Plan  ASA: 2  Anesthesia Plan: General   Post-op Pain Management:    Induction:   PONV Risk Score and Plan: Propofol infusion  Airway Management Planned:   Additional Equipment:   Intra-op Plan:   Post-operative Plan:   Informed Consent: I have reviewed the patients History and Physical, chart, labs and discussed the procedure including the risks, benefits and alternatives for the proposed anesthesia with the patient or authorized representative who has indicated his/her understanding and acceptance.     Dental Advisory Given  Plan Discussed with: CRNA  Anesthesia Plan Comments:        Anesthesia Quick Evaluation  

## 2023-07-04 NOTE — Discharge Instructions (Signed)
  Colonoscopy Discharge Instructions  Read the instructions outlined below and refer to this sheet in the next few weeks. These discharge instructions provide you with general information on caring for yourself after you leave the hospital. Your doctor may also give you specific instructions. While your treatment has been planned according to the most current medical practices available, unavoidable complications occasionally occur.   ACTIVITY You may resume your regular activity, but move at a slower pace for the next 24 hours.  Take frequent rest periods for the next 24 hours.  Walking will help get rid of the air and reduce the bloated feeling in your belly (abdomen).  No driving for 24 hours (because of the medicine (anesthesia) used during the test).   Do not sign any important legal documents or operate any machinery for 24 hours (because of the anesthesia used during the test).  NUTRITION Drink plenty of fluids.  You may resume your normal diet as instructed by your doctor.  Begin with a light meal and progress to your normal diet. Heavy or fried foods are harder to digest and may make you feel sick to your stomach (nauseated).  Avoid alcoholic beverages for 24 hours or as instructed.  MEDICATIONS You may resume your normal medications unless your doctor tells you otherwise.  WHAT YOU CAN EXPECT TODAY Some feelings of bloating in the abdomen.  Passage of more gas than usual.  Spotting of blood in your stool or on the toilet paper.  IF YOU HAD POLYPS REMOVED DURING THE COLONOSCOPY: No aspirin products for 7 days or as instructed.  No alcohol for 7 days or as instructed.  Eat a soft diet for the next 24 hours.  FINDING OUT THE RESULTS OF YOUR TEST Not all test results are available during your visit. If your test results are not back during the visit, make an appointment with your caregiver to find out the results. Do not assume everything is normal if you have not heard from your  caregiver or the medical facility. It is important for you to follow up on all of your test results.  SEEK IMMEDIATE MEDICAL ATTENTION IF: You have more than a spotting of blood in your stool.  Your belly is swollen (abdominal distention).  You are nauseated or vomiting.  You have a temperature over 101.  You have abdominal pain or discomfort that is severe or gets worse throughout the day.   Your colonoscopy was relatively unremarkable.  I did not find any polyps or evidence of colon cancer.  Given your age, I do not think we need to repeat colonoscopy for colon cancer screening going forward.   Follow-up with GI as needed.   I hope you have a great rest of your week!  Hennie Duos. Marletta Lor, D.O. Gastroenterology and Hepatology [facility]

## 2023-07-04 NOTE — Op Note (Addendum)
[facility] Patient Name: Courtney Hayes Procedure Date: 07/04/2023 11:10 AM MRN: [id number] Date of Birth: 12-May-1946 Attending MD: Hennie Duos. Marletta Lor , [place], [id number] CSN: [id number] Age: 77 Admit Type: Outpatient Procedure:                Colonoscopy Indications:              Screening for colorectal malignant neoplasm Providers:                Hennie Duos. Marletta Lor, DO, Sheran Fava, Pandora Leiter, Technician Referring MD:              Medicines:                See the Anesthesia note for documentation of the                            administered medications Complications:            No immediate complications. Estimated Blood Loss:     Estimated blood loss: none. Procedure:                Pre-Anesthesia Assessment:                           - The anesthesia plan was to use monitored                            anesthesia care (MAC).                           After obtaining informed consent, the colonoscope                            was passed under direct vision. Throughout the                            procedure, the patient's blood pressure, pulse, and                            oxygen saturations were monitored continuously. The                            [id number]-HQ190L ([id number]) scope was introduced through                            the anus and advanced to the the cecum, identified                            by appendiceal orifice and ileocecal valve. The                            colonoscopy was performed without difficulty. The                            patient tolerated the procedure well. The quality  of the bowel preparation was evaluated using the                            BBPS [facility] Bowel Preparation Scale) with scores                            of: Right Colon = 3, Transverse Colon = 3 and Left                            Colon = 3 (entire mucosa seen well with no residual                             staining, small fragments of stool or opaque                            liquid). The total BBPS score equals 9. Scope In: 11:23:03 AM Scope Out: 11:35:16 AM Scope Withdrawal Time: 0 hours 7 minutes 49 seconds  Total Procedure Duration: 0 hours 12 minutes 13 seconds  Findings:      Non-bleeding internal hemorrhoids were found during endoscopy.      The exam was otherwise without abnormality. Impression:               - Non-bleeding internal hemorrhoids.                           - The examination was otherwise normal.                           - No specimens collected. Moderate Sedation:      Per Anesthesia Care Recommendation:           - Patient has a contact number available for                            emergencies. The signs and symptoms of potential                            delayed complications were discussed with the                            patient. Return to normal activities tomorrow.                            Written discharge instructions were provided to the                            patient.                           - Resume previous diet.                           - Continue present medications.                           - No repeat colonoscopy  due to age.                           - Return to GI clinic PRN. Procedure Code(s):        --- Professional ---                           K7425, Colorectal cancer screening; colonoscopy on                            individual not meeting criteria for high risk Diagnosis Code(s):        --- Professional ---                           Z12.11, Encounter for screening for malignant                            neoplasm of colon                           K64.8, Other hemorrhoids CPT copyright 2022 American Medical Association. All rights reserved. The codes documented in this report are preliminary and upon coder review may  be revised to meet current compliance requirements. Hennie Duos. Marletta Lor, DO Hennie Duos. Marletta Lor,  DO 07/04/2023 11:36:38 AM This report has been signed electronically. Number of Addenda: 0

## 2023-07-04 NOTE — Transfer of Care (Signed)
Immediate Anesthesia Transfer of Care Note  Patient: DOMENIQUE QUEST  Procedure(s) Performed: COLONOSCOPY WITH PROPOFOL  Patient Location: Short Stay  Anesthesia Type:General  Level of Consciousness: drowsy  Airway & Oxygen Therapy: Patient Spontanous Breathing  Post-op Assessment: Report given to RN and Post -op Vital signs reviewed and stable  Post vital signs: Reviewed and stable  Last Vitals:  Vitals Value Taken Time  BP 99/64 07/04/23 1142  Temp 36.4 C 07/04/23 1139  Pulse 58 07/04/23 1139  Resp 18 07/04/23 1142  SpO2 98 % 07/04/23 1142    Last Pain:  Vitals:   07/04/23 1139  TempSrc: Oral  PainSc: Asleep         Complications: No notable events documented.

## 2023-07-04 NOTE — H&P (Signed)
Primary Care Physician:  Carylon Perches, MD Primary Gastroenterologist:  Dr. Marletta Lor  Pre-Procedure History & Physical: HPI:  NOBUKO GSELL is a 77 y.o. female is here for a colonoscopy for colon cancer screening purposes.  Patient denies any family history of colorectal cancer.  No melena or hematochezia.  No abdominal pain or unintentional weight loss.  No change in bowel habits.  Overall feels well from a GI standpoint.  Past Medical History:  Diagnosis Date   Cerebral aneurysm rupture (HCC) 04/12/1988   GERD (gastroesophageal reflux disease)    Hx of colonic polyps     Past Surgical History:  Procedure Laterality Date   CEREBRAL ANEURYSM REPAIR     COLONOSCOPY  02/09/2007   [id number] sigmoid colon and external hemorrhoids   COLONOSCOPY N/A 03/06/2018   torturous left colon, internal hemorrhoids   COLONOSCOPY WITH ESOPHAGOGASTRODUODENOSCOPY (EGD) N/A 02/13/2013   Procedure: COLONOSCOPY WITH ESOPHAGOGASTRODUODENOSCOPY (EGD);  Surgeon: West Bali, MD;  [facility];  Service: Endoscopy;  Laterality: N/A;  11:00   ESOPHAGOGASTRODUODENOSCOPY  02/09/2007   SLF: Normal esophagus without evidence of Barrett's   FEMORAL ARTERY REPAIR     TONSILLECTOMY      Prior to Admission medications   Medication Sig Start Date End Date Taking? Authorizing Provider  donepezil (ARICEPT) 10 MG tablet Take 10 mg by mouth every morning.   Yes [provider]  ibandronate (BONIVA) 150 MG tablet Take 150 mg by mouth every 30 (thirty) days. Take in the morning with a full glass of water, on an empty stomach, and do not take anything else by mouth or lie down for the next 30 min.   Yes [provider]  Multiple Vitamins-Minerals (MULTIVITAMIN WITH MINERALS) tablet Take 1 tablet by mouth daily.   Yes [provider]  EPINEPHrine 0.3 mg/0.3 mL IJ SOAJ injection Inject 0.3 mg into the muscle as needed for anaphylaxis. 05/20/23   [provider]    Allergies as  of 06/06/2023 - Review Complete 05/03/2023  Allergen Reaction Noted   Formaldehyde Other (See Comments) 02/23/2018   Isothiazolinone chloride Other (See Comments) 02/23/2018   Niacin and related Hives 02/06/2013   Quaternium-15 Other (See Comments) 02/23/2018   Thimerosal (thiomersal) Other (See Comments) 02/23/2018   Valium [diazepam] Hives 02/06/2013    Family History  Problem Relation Age of Onset   Colon cancer Neg Hx    Colon polyps Neg Hx     Social History   Socioeconomic History   Marital status: Married    Spouse name: Not on file   Number of children: 3   Years of education: Not on file   Highest education level: Not on file  Occupational History   Occupation: unemployed, Masters in Landscape architect: UNEMPLOYED  Tobacco Use   Smoking status: Never   Smokeless tobacco: Never  Vaping Use   Vaping status: Never Used  Substance and Sexual Activity   Alcohol use: No   Drug use: No   Sexual activity: Yes  Other Topics Concern   Not on file  Social History Narrative   Lives w/ husband 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[employer])   Social Determinants of Health   Financial Resource Strain: Not on file  Food Insecurity: Not on file  Transportation Needs: Not on file  Physical Activity: Not on file  Stress: Not on file  Social Connections: Not on file  Intimate Partner Violence: Not on file    Review of Systems: See HPI,  otherwise negative ROS  Physical Exam: Vital signs in last 24 hours: Temp:  [98.1 F (36.7 C)] 98.1 F (36.7 C) (07/22 0949) Pulse Rate:  [62] 62 (07/22 0949) Resp:  [27] 27 (07/22 0949) BP: (121)/(61) 121/61 (07/22 0949) SpO2:  [100 %] 100 % (07/22 0949) Weight:  [53.5 kg] 53.5 kg (07/22 0949)   General:   Alert,  Well-developed, well-nourished, pleasant and cooperative in NAD Head:  Normocephalic and atraumatic. Eyes:  Sclera clear, no icterus.   Conjunctiva pink. Ears:  Normal auditory acuity. Nose:  No deformity, discharge,  or  lesions. Msk:  Symmetrical without gross deformities. Normal posture. Extremities:  Without clubbing or edema. Neurologic:  Alert and  oriented x4;  grossly normal neurologically. Skin:  Intact without significant lesions or rashes. Psych:  Alert and cooperative. Normal mood and affect.  Impression/Plan: NEALA MIGGINS is here for a colonoscopy to be performed for colon cancer screening purposes.  The risks of the procedure including infection, bleed, or perforation as well as benefits, limitations, alternatives and imponderables have been reviewed with the patient. Questions have been answered. All parties agreeable.

## 2023-07-07 ENCOUNTER — Encounter (HOSPITAL_COMMUNITY): Payer: Self-pay | Admitting: Internal Medicine

## 2023-09-13 ENCOUNTER — Other Ambulatory Visit (HOSPITAL_COMMUNITY): Payer: Self-pay | Admitting: Internal Medicine

## 2023-09-13 DIAGNOSIS — Z1231 Encounter for screening mammogram for malignant neoplasm of breast: Secondary | ICD-10-CM

## 2023-09-15 DIAGNOSIS — G3184 Mild cognitive impairment, so stated: Secondary | ICD-10-CM | POA: Diagnosis not present

## 2023-09-15 DIAGNOSIS — I609 Nontraumatic subarachnoid hemorrhage, unspecified: Secondary | ICD-10-CM | POA: Diagnosis not present

## 2023-09-15 DIAGNOSIS — D519 Vitamin B12 deficiency anemia, unspecified: Secondary | ICD-10-CM | POA: Diagnosis not present

## 2023-09-15 DIAGNOSIS — Z79899 Other long term (current) drug therapy: Secondary | ICD-10-CM | POA: Diagnosis not present

## 2023-09-15 DIAGNOSIS — R001 Bradycardia, unspecified: Secondary | ICD-10-CM | POA: Diagnosis not present

## 2023-09-15 DIAGNOSIS — K219 Gastro-esophageal reflux disease without esophagitis: Secondary | ICD-10-CM | POA: Diagnosis not present

## 2023-09-22 ENCOUNTER — Ambulatory Visit (HOSPITAL_COMMUNITY)
Admission: RE | Admit: 2023-09-22 | Discharge: 2023-09-22 | Disposition: A | Discharge status: Home / Self Care | Payer: Medicare Other | Source: Ambulatory Visit | Attending: Internal Medicine | Admitting: Internal Medicine

## 2023-09-22 DIAGNOSIS — R001 Bradycardia, unspecified: Secondary | ICD-10-CM | POA: Diagnosis not present

## 2023-09-22 DIAGNOSIS — Z1231 Encounter for screening mammogram for malignant neoplasm of breast: Secondary | ICD-10-CM | POA: Insufficient documentation

## 2023-09-22 DIAGNOSIS — G309 Alzheimer's disease, unspecified: Secondary | ICD-10-CM | POA: Diagnosis not present

## 2023-09-22 DIAGNOSIS — D519 Vitamin B12 deficiency anemia, unspecified: Secondary | ICD-10-CM | POA: Diagnosis not present

## 2024-04-23 DIAGNOSIS — Z961 Presence of intraocular lens: Secondary | ICD-10-CM | POA: Diagnosis not present

## 2024-04-23 DIAGNOSIS — H40013 Open angle with borderline findings, low risk, bilateral: Secondary | ICD-10-CM | POA: Diagnosis not present

## 2024-05-10 DIAGNOSIS — R413 Other amnesia: Secondary | ICD-10-CM | POA: Diagnosis not present

## 2024-05-10 DIAGNOSIS — G301 Alzheimer's disease with late onset: Secondary | ICD-10-CM | POA: Diagnosis not present

## 2024-05-10 DIAGNOSIS — F02B4 Dementia in other diseases classified elsewhere, moderate, with anxiety: Secondary | ICD-10-CM | POA: Diagnosis not present

## 2024-05-17 DIAGNOSIS — R413 Other amnesia: Secondary | ICD-10-CM | POA: Diagnosis not present

## 2024-05-17 DIAGNOSIS — G301 Alzheimer's disease with late onset: Secondary | ICD-10-CM | POA: Diagnosis not present

## 2024-05-17 DIAGNOSIS — F02B4 Dementia in other diseases classified elsewhere, moderate, with anxiety: Secondary | ICD-10-CM | POA: Diagnosis not present

## 2024-05-31 DIAGNOSIS — Z961 Presence of intraocular lens: Secondary | ICD-10-CM | POA: Diagnosis not present

## 2024-05-31 DIAGNOSIS — H40013 Open angle with borderline findings, low risk, bilateral: Secondary | ICD-10-CM | POA: Diagnosis not present

## 2024-07-12 DIAGNOSIS — F0390 Unspecified dementia without behavioral disturbance: Secondary | ICD-10-CM | POA: Diagnosis not present

## 2024-08-10 DIAGNOSIS — F0390 Unspecified dementia without behavioral disturbance: Secondary | ICD-10-CM | POA: Diagnosis not present

## 2024-08-31 ENCOUNTER — Other Ambulatory Visit (HOSPITAL_COMMUNITY): Payer: Self-pay | Admitting: Internal Medicine

## 2024-08-31 DIAGNOSIS — Z1231 Encounter for screening mammogram for malignant neoplasm of breast: Secondary | ICD-10-CM

## 2024-09-11 NOTE — Progress Notes (Signed)
  Guide Activity <redacted file path>  Call patient's husband/caretaker Mr. Rebert.   There was no answer, left voice message for a return call to [phone]  Electronically signed by: Buford JONELLE Prom, CMA 09/11/2024 3:13 PM

## 2024-09-24 ENCOUNTER — Ambulatory Visit (HOSPITAL_COMMUNITY)
Admission: RE | Admit: 2024-09-24 | Discharge: 2024-09-24 | Disposition: A | Discharge status: Home / Self Care | Source: Ambulatory Visit | Attending: Internal Medicine | Admitting: Internal Medicine

## 2024-09-24 DIAGNOSIS — Z1231 Encounter for screening mammogram for malignant neoplasm of breast: Secondary | ICD-10-CM | POA: Diagnosis not present

## 2024-09-25 DIAGNOSIS — G3184 Mild cognitive impairment, so stated: Secondary | ICD-10-CM | POA: Diagnosis not present

## 2024-09-25 DIAGNOSIS — I609 Nontraumatic subarachnoid hemorrhage, unspecified: Secondary | ICD-10-CM | POA: Diagnosis not present

## 2024-09-25 DIAGNOSIS — D519 Vitamin B12 deficiency anemia, unspecified: Secondary | ICD-10-CM | POA: Diagnosis not present

## 2024-09-25 DIAGNOSIS — Z79899 Other long term (current) drug therapy: Secondary | ICD-10-CM | POA: Diagnosis not present

## 2024-09-25 DIAGNOSIS — R001 Bradycardia, unspecified: Secondary | ICD-10-CM | POA: Diagnosis not present

## 2024-09-25 DIAGNOSIS — K219 Gastro-esophageal reflux disease without esophagitis: Secondary | ICD-10-CM | POA: Diagnosis not present

## 2024-10-01 DIAGNOSIS — G300 Alzheimer's disease with early onset: Secondary | ICD-10-CM | POA: Diagnosis not present

## 2024-10-01 DIAGNOSIS — Z0001 Encounter for general adult medical examination with abnormal findings: Secondary | ICD-10-CM | POA: Diagnosis not present

## 2024-10-01 DIAGNOSIS — R001 Bradycardia, unspecified: Secondary | ICD-10-CM | POA: Diagnosis not present

## 2024-10-01 DIAGNOSIS — D519 Vitamin B12 deficiency anemia, unspecified: Secondary | ICD-10-CM | POA: Diagnosis not present

## 2024-10-01 DIAGNOSIS — Z8673 Personal history of transient ischemic attack (TIA), and cerebral infarction without residual deficits: Secondary | ICD-10-CM | POA: Diagnosis not present

## 2024-10-01 DIAGNOSIS — Z23 Encounter for immunization: Secondary | ICD-10-CM | POA: Diagnosis not present
